# Patient Record
Sex: Female | Born: 1955 | Race: White | Hispanic: No | Marital: Married | Smoking: Former smoker
Health system: Southern US, Community
[De-identification: ages and names within clinical notes are randomized; demographics above are authoritative.]

## PROBLEM LIST (undated history)

## (undated) DIAGNOSIS — Z8719 Personal history of other diseases of the digestive system: Secondary | ICD-10-CM

## (undated) DIAGNOSIS — I480 Paroxysmal atrial fibrillation: Secondary | ICD-10-CM

## (undated) DIAGNOSIS — Z8679 Personal history of other diseases of the circulatory system: Secondary | ICD-10-CM

## (undated) DIAGNOSIS — G4733 Obstructive sleep apnea (adult) (pediatric): Secondary | ICD-10-CM

## (undated) DIAGNOSIS — R001 Bradycardia, unspecified: Secondary | ICD-10-CM

## (undated) DIAGNOSIS — E785 Hyperlipidemia, unspecified: Secondary | ICD-10-CM

## (undated) DIAGNOSIS — I10 Essential (primary) hypertension: Secondary | ICD-10-CM

## (undated) DIAGNOSIS — K5792 Diverticulitis of intestine, part unspecified, without perforation or abscess without bleeding: Secondary | ICD-10-CM

## (undated) DIAGNOSIS — M1990 Unspecified osteoarthritis, unspecified site: Secondary | ICD-10-CM

## (undated) HISTORY — DX: Personal history of other diseases of the digestive system: Z87.19

## (undated) HISTORY — DX: Paroxysmal atrial fibrillation: I48.0

## (undated) HISTORY — DX: Hyperlipidemia, unspecified: E78.5

## (undated) HISTORY — PX: REPLACEMENT TOTAL KNEE BILATERAL: SUR1225

## (undated) HISTORY — DX: Diverticulitis of intestine, part unspecified, without perforation or abscess without bleeding: K57.92

## (undated) HISTORY — DX: Personal history of other diseases of the circulatory system: Z86.79

## (undated) HISTORY — DX: Bradycardia, unspecified: R00.1

## (undated) HISTORY — DX: Obstructive sleep apnea (adult) (pediatric): G47.33

## (undated) HISTORY — PX: CARDIAC CATHETERIZATION: SHX172

## (undated) HISTORY — DX: Unspecified osteoarthritis, unspecified site: M19.90

## (undated) HISTORY — DX: Morbid (severe) obesity due to excess calories: E66.01

## (undated) HISTORY — DX: Essential (primary) hypertension: I10

---

## 2012-07-04 LAB — PULMONARY FUNCTION TEST

## 2012-08-22 ENCOUNTER — Ambulatory Visit

## 2015-12-31 HISTORY — PX: ATRIAL FIBRILLATION ABLATION: EP1191

## 2016-03-21 ENCOUNTER — Ambulatory Visit (HOSPITAL_BASED_OUTPATIENT_CLINIC_OR_DEPARTMENT_OTHER): Admitting: Nurse Practitioner

## 2016-03-29 ENCOUNTER — Ambulatory Visit: Admitting: Nurse Practitioner

## 2016-03-29 NOTE — Progress Notes (Signed)
* * *         [facility], [facility]**        ---    Lawernce Ion. Sindy Messing, MD [facility] Kieth Brightly. Donnetta Simpers, MD [facility] Barbera Setters Byrd Hesselbach, MD  [facility];    Darlyn Chamber, MD [facility] Roosvelt Maser, MD [facility] Shanon Brow, MD [facility]  Nile Dear. Audley Hose, MD [facility]    Kandis Mannan A. Karie Mainland, MD [facility] Johnell Comings. MacNaught, MD [facility] Mitchel Honour, MD Erma Pinto, MD [facility]    Colletta Maryland, MD Weston Anna, MD [facility] Kerby Moors, NP Maximino Greenland ,  NP        * * *     **Patient Name:** Emma [name]   **Date:** 03/29/2016    --- ---     **DOB:** 1956-05-07     **Referring Provider:** Mardene Sayer   **Appointment Provider:** Danelle Earthly, ACNP        * * *    03/29/2016  Progress Notes: Danelle Earthly, ACNP    --- ---    ---        Current Medications    ---    Taking     * loratadine 10 mg tablet 1 tab(s) orally once a day    ---    * losartan 50 mg tablet 1 tab(s) orally BID    ---    * Xarelto 20 mg tablet TAKE ONE TABLET BY MOUTH IN THE EVENING orally Q.D.    ---    * Tylenol Extra Strength 500 mg tablet 2 tab(s) orally prn    ---    * Metoprolol Succinate ER 25 mg tablet, extended release 1 tab(s) orally bid    ---    * flecainide 50 mg tablet 1 tab(s) orally bid    ---    * hydrochlorothiazide 25 mg tablet 1 tab(s) orally once a day    ---    * Medication List reviewed and reconciled with the patient    ---      Past Medical History    ---      PAF    ---    HTN    ---    Obesity    ---    Osteoarthritis    ---    Spinal stenosis    ---      Surgical History    ---      C section    ---    rhinoplasty    ---    knee replacement    ---      Family History    ---      Father: deceased 1 yrs, CAD, MI, AAA    ---    Mother: deceased 29 yrs, Died of CA, initially lymphoma then sarcomas in leg  with mets    ---    Siblings: sister- HTN, CAD    ---      Social History    ---    no Smoking    Discontinued: _1980s_    Status: _former smoker_    How long has it been since you last smoked? _> 10 years_    Patient  counseled on the dangers of tobacco use: _06/29/2017_    Advised to lose weight: Yes Discussed elevated BMI with the patient and  importance of losing weight for cardiovascular and general overall health.    No Presence of Falls .    no ETOH.    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    ---      Allergies    ---      ace inhibitors    ---      Review of Systems    ---     _._ :    no Change in Vision. no Major change in weight. no Nausea. no vomitting. no  Diarrhea. no hematemesis. no Blood in urine. no blood in stool. no black,  tarry stool. no focal weakness, numbness or tingling. no rashes. no  hallucinations. no fevers, chills. no decrease in appetite. no Intrascapular  pain. no Bruising. no abdominal pain.    _Cardiology_ :    no chest pain. no palpitations. no leg edema. no dizziness. no shortness of  breath. no Orthopnea. no PND. no claudication. no syncope. no dyspnea with  exertion. no exercise intolerance.            Reason for Appointment    ---      1\. Palpitations and recurrent pAF    ---      History of Present Illness    ---     _HPI_ :    60 year old with a history of PAF, hypertension, and obesity who is seen today  per her request for evaluation of 2 episodes of palpitations. Per her report  was admitted to [facility] in rapid AF in April of this year  and required dilt gtt. Converted after about 5 hrs and was sent home on her  usual Flecainide and Metoprolol doses. She did well until about 5 days ago  when she developed palps with a HR of 120 bpm. She was due for her next dose  of Flecainide and Metoprolol and after taking her meds she went to bed and  woke up the next day in SR. She remains on Xarelto and is fully compliant. Had  been having issue with Cpap machine at home but a week ago got a new mask and  has been using it nightly without issues. She continues to work nights, has  gained weight, and remains relatively inactive.    Denies chest pain or  dyspnea nausea vomiting diaphoresis PND orthopnea  lightheadedness palpitations or syncope. Has chronic, dependent LE edema  unchanged.      Vital Signs    ---    HR 56, BP 132/96, Wt 330 (+12), Ht 65, BMI 54.91, Repeat BP 136/94, Med  Assist: AN.      Examination    ---     _HS_ :    General: pleasant, obese, middle aged female, in no distress.    ENMT EOMI, anicteric, MMM, OP clear.    Neck: no JVD, bruits, or lymphadenopathy.    Pulmonary System clear to auscultation bilaterally, no wheezes or crackles.    Cardiac: PMI is nondisplaced, normal S1 S2, regular rate, no murmur, gallop or  pericardial rub.    GI SYSTEM obese, soft, NT/ND, BS present, no guarding or rigidity.    Extremities no clubbing, no edema, + lymphedema.    Peripheral pulses: normal (2+) bilaterally.    Skin: normal, no rash.    Neurologic exam: grossly non-focal.    Pyschiatric appropriate.          Data    ---     _EKG (reviewed personally)_ :    03/16/15:Sinus rhythm, 62.    _Lipids_ :    4/16: TC 214 TRI 103 HDL 60  LDL 133.    3/15: TC 214, TG 68, HDL 58, LDL 142.    _Labs_ :    3/15: Na 140, K 4, BUN 15, Cr 0.6, Hgb 14 .    _Lexiscan Myoview_ :    2012: normal.          Impression/Recommendations    ---       **1\. Paroxysmal atrial fibrillation**    Start Metoprolol Tartrate tablet, 25 mg, 1 tab(s), orally, orally once daily  as needed for episodes of atrial fibrillation, 30 day(s), 30, Refills 0    _IMAGING: **EKG_   Results reviewed by Physician  results reviewed by  physician     --- --- ---        Clinical Notes: Intermittent, pAF with generally rare episodes although 2  episodes in last 6 mo. She is very symptomatic with palpitations and HR can  get up to 160 - 170 bpm in AF. Has always been self limited. Recent episodes ?  precipitated by poorly functioning Cpap mask which has now been replaced.  Options including higher dose of Flecainide vs PRN "rescue" short acting BB  reviewed. She has elected to trial PRN BB. She will remain on  BID Metoprolol  Succinate as previous. If she has an episode of palps has been instructed to  take 25 mg of Metoprolol Tartrate PRN. To call if episode frequency continue  to increase as ablation may be a consideration as well. Ultimately I have  advised that she work on weight loss and continue with recent compliance with  Cpap.        **2\. Essential (primary) hypertension**    Clinical Notes: Well controlled on currant regimen. No issues with orthostasis  or hypotension.        **3\. Hypercholesterolemia**    Notes: Patient Educated with: AHANutritionSheet.pdf (AHANutritionSheet.pdf).    Clinical Notes: Benefits of weight loss and gradual graded exercise program  reviewed. Requesting recent lab work from her PCP.      Procedure Codes    ---      93000 IH    ---      Follow Up    ---    Dr. Audley Hose 2 mo    Electronically signed by Homero Fellers MD on 04/01/2016 at 07:31 PM EDT    Sign off status: Completed        * * *        [facility]    [address]    [place], [place] [address]    Tel: [phone]    Fax: [phone]              * * *          Patient: AVLEEN, BORDWELL DOB: 20-Feb-1956 Progress Note: Danelle Earthly, ACNP  03/29/2016    ---    Note generated by eClinicalWorks EMR/PM Software ([email or web address])

## 2016-03-29 NOTE — Progress Notes (Signed)
* * *        **  [name]    --- ---    28 Y old Female, DOB: Jul 23, 1956    [address], [address], [place], [place] [address]    Home: [phone]    Provider: Danelle Earthly        * * *    Telephone Encounter    ---    Answered by   Danelle Earthly  Date: 03/29/2016         Time: 02:18 PM    Reason   recent labs    --- ---            Message                      Had labs at [facility] in April 2017.  PCP has a copy.  Can we please get copy from her PCP                Action Taken   Neill Jurewicz 03/29/2016 2:19:06 PM > . Dominique,Denise 04/04/2016  10:41:09 AM > called pcp..they have no idea where the labs were  put...suggested I get them from [facility] general..Marland KitchenI faxed for copy of labs  Dominique,Denise 04/20/2016 12:47:44 PM > labs are in documents Janeane Cozart  04/23/2016 4:02:18 PM > Reviewed and WNL                * * *                ---          * * *          Patient: Emma [name] A DOB: 07-15-1956 Provider: Danelle Earthly  03/29/2016    ---    Note generated by eClinicalWorks EMR/PM Software ([email or web address])

## 2016-04-11 ENCOUNTER — Ambulatory Visit

## 2016-04-20 ENCOUNTER — Ambulatory Visit

## 2016-06-21 ENCOUNTER — Ambulatory Visit: Admitting: Cardiovascular Disease

## 2016-06-21 NOTE — Progress Notes (Signed)
* * *         [facility], [facility]**        ---    Lawernce Ion. Sindy Messing, MD [facility] Kieth Brightly. Donnetta Simpers, MD [facility] Barbera Setters Byrd Hesselbach, MD  [facility];    Darlyn Chamber, MD [facility] Roosvelt Maser, MD [facility] Shanon Brow, MD [facility]  Nile Dear. Audley Hose, MD [facility]    Kandis Mannan A. Karie Mainland, MD [facility] Johnell Comings. MacNaught, MD [facility] Mitchel Honour, MD Erma Pinto, MD [facility]    Colletta Maryland, MD Weston Anna, MD [facility] Kerby Moors, NP Maximino Greenland ,  NP        * * *     **Patient Name:** Emma [name]   **Date:** 06/21/2016    --- ---     **DOB:** 02-27-1956     **Referring Provider:** Mardene Sayer   **Appointment Provider:**  [name], MD        * * *    06/21/2016  Progress Notes: [name], MD    --- ---    ---        Current Medications    ---    Taking     * Metoprolol Tartrate 25 mg tablet 1 tab(s) orally orally once daily as needed for episodes of atrial fibrillation    ---    * loratadine 10 mg tablet 1 tab(s) orally once a day    ---    * losartan 50 mg tablet 1 tab(s) orally BID    ---    * Xarelto 20 mg tablet TAKE ONE TABLET BY MOUTH IN THE EVENING orally Q.D.    ---    * Tylenol Extra Strength 500 mg tablet 2 tab(s) orally prn    ---    * Metoprolol Succinate ER 25 mg tablet, extended release 1 tab(s) orally bid    ---    * flecainide 50 mg tablet 1 tab(s) orally bid    ---    * hydrochlorothiazide 25 mg tablet 1 tab(s) orally once a day    ---    * Medication List reviewed and reconciled with the patient    ---      Past Medical History    ---      PAF    ---    HTN    ---    Obesity    ---    Osteoarthritis    ---    Spinal stenosis    ---      Surgical History    ---      C section    ---    rhinoplasty    ---    knee replacement    ---      Family History    ---      Father: deceased 38 yrs, CAD, MI, AAA    ---    Mother: deceased 63 yrs, Died of CA, initially lymphoma then sarcomas in leg  with mets    ---    Siblings: sister- HTN, CAD    ---      Social History    ---     Smoking Discontinued: 1980s, Status: former smoker, How long has it been since  you last smoked? > 10 years, Patient counseled on the dangers of tobacco use:  03/29/2016.    Advised to lose weight: Yes Discussed elevated BMI with the patient and  importance of losing weight for cardiovascular and general overall health,  Discussed elevated BMI with the patient and importance of losing weight for  cardiovascular and general overall health.    No Presence of Falls .    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    ---      Allergies    ---      ace inhibitors    ---      Review of Systems    ---     _._ :    no Change in Vision. no Major change in weight. no Nausea. no vomitting. no  Diarrhea. no hematemesis. no Blood in urine. no blood in stool. no black,  tarry stool. no focal weakness, numbness or tingling. no rashes. no  hallucinations. no fevers, chills. no decrease in appetite. no Intrascapular  pain. no Bruising. no abdominal pain.            Reason for Appointment    ---      1\. 2 mos per Nancy;called pt mailed appt. card    ---      History of Present Illness    ---     _HPI_ :    60 year old with a history of PAF, hypertension, and obesity, followed by Dr.  Ruffin Frederick, who presents for followup. She was last seen 3 months ago by NP Jacqulynn Cadet  due to recurrent atrial fibrillation.. Since her last visit no recurrence of  palpitations or atrial fibrillation. Has not needed as needed metoprolol.  Taking medications fairly consistently with only an occasional missed dose.  Otherwise denies chest pain or dyspnea nausea vomiting diaphoresis PND  orthopnea lightheadedness palpitations or syncope.      Vital Signs    ---    HR 52, BP 120/82, Wt 328, Ht 65, BMI 54.58.      Examination    ---     _HS_ :    General: pleasant, obese, middle aged female, in no distress. ENMT EOMI,  anicteric, MMM, OP clear. Neck: no JVD, bruits, or lymphadenopathy. Pulmonary  System clear to auscultation bilaterally, no  wheezes or crackles. Cardiac: PMI  is nondisplaced, normal S1 S2, regular rate, no murmur, gallop or pericardial  rub. GI SYSTEM obese, soft, NT/ND, BS present, no guarding or rigidity.  Extremities no clubbing, no edema, + lymphedema. Peripheral pulses: normal  (2+) bilaterally. Skin: normal, no rash. Neurologic exam: grossly non-focal.  Pyschiatric appropriate.          Data    ---     _EKG (reviewed personally)_ :    06/21/16: Sinus rhythm, 52, poor R wave progression.    _Lipids_ :    4/16: TC 214 TRI 103 HDL 60 LDL 133. 3/15: TC 214, TG 68, HDL 58, LDL 142.    _Lexiscan Myoview_ :    2012: normal.          Impression/Recommendations    ---       **1\. Paroxysmal atrial fibrillation**    Continue flecainide tablet, 50 mg, 1 tab(s), orally, bid, 90, 180, Refills 3    Continue hydrochlorothiazide tablet, 25 mg, 1 tab(s), orally, once a day, 90,  90, Refills 3    _IMAGING: **EKG_    Clinical Notes: Paroxysmal AF, reasonably controlled with flecainide. Using  metoprolol as needed and doing relatively well. No changes made  today.utilizing CPAP mask consistently.    ---        **2\. Hypercholesterolemia**    Notes: Patient Educated with: AHANutritionSheet.pdf (AHANutritionSheet.pdf).  Clinical Notes: Strongly encouraged for weight loss.        **3\. Essential (primary) hypertension**    Clinical Notes: Normotensive and stable current regimen.      Procedure Codes    ---      93000 IH    ---      Follow Up    ---    6 Months    Electronically signed by Homero Fellers MD on 06/21/2016 at 02:36 PM EDT    Sign off status: Completed        * * *        [facility]    [address]    [place], [place] [address]    Tel: [phone]    Fax: [phone]              * * *          Patient: DANYELE, SMEJKAL DOB: 1956/06/01 Progress Note: [name], MD 06/21/2016    ---    Note generated by eClinicalWorks EMR/PM Software ([email or web address])

## 2016-07-31 ENCOUNTER — Inpatient Hospital Stay
Admit: 2016-07-31 | Disposition: A | Discharge status: Home / Self Care | Source: Home / Self Care | Attending: Internal Medicine | Admitting: Internal Medicine

## 2016-07-31 LAB — HX .AUTOMATED DIFF
CASE NUMBER: 2017304002620
HX ABSOLUTE NEUTRO COUNT: 5280 /mm3
HX BASOPHILS: 1 % — NL (ref 0.0–1.0)
HX EOSINOPHILS: 4 % — ABNORMAL HIGH (ref 0.0–3.0)
HX IMMATURE GRANULOCYTES: 0 % — NL (ref 0.0–2.0)
HX LYMPHOCYTES: 24 % — NL (ref 22.0–40.0)
HX MONOCYTES: 6 % — NL (ref 0.0–11.0)
HX NEU CT MEASURED: 5.28
HX NEUTROPHILS: 64 % — NL (ref 40.0–71.0)

## 2016-07-31 LAB — HX CBC W/ DIFF
CASE NUMBER: 2017304002620
HX HCT: 42.7 % — NL (ref 36.0–47.0)
HX HGB: 14.3 g/dL — NL (ref 11.8–15.8)
HX MCH: 30 pg — NL (ref 27.0–31.0)
HX MCHC: 33.5 g/dL — NL (ref 32.0–36.0)
HX MCV: 90 fL — NL (ref 81.0–99.0)
HX MPV: 9.3 fL — NL (ref 7.4–11.5)
HX NRBC PERCENT: 0 % — NL
HX PLATELET: 330 10*3/uL — NL (ref 150.0–400.0)
HX RBC: 4.76 10*6/uL — NL (ref 3.6–5.0)
HX RDW: 13.2 % — NL (ref 11.5–14.5)
HX WBC: 8.3 10*3/uL — NL (ref 3.7–11.2)

## 2016-07-31 LAB — HX PTT
CASE NUMBER: 2017304002620
HX PTT: 34.3 s — ABNORMAL HIGH (ref 23.4–32.1)

## 2016-07-31 LAB — HX PT
CASE NUMBER: 2017304002620
HX INR: 1.1
HX PT: 12.2 s — ABNORMAL HIGH (ref 9.3–11.6)
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
## 2016-07-31 NOTE — H&P (Signed)
cardiac enzymes -3 negative    Thyroid profile within normal limits    We'll check UA, urine culture    SIGNATURE LINE Electronically signed by Derry Lory MD-HOSP, Laaibah Wartman on  08/01/2016 at 15:47:44 EST

## 2016-07-31 NOTE — H&P (Signed)
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 While  she was working yesterday, noticed palpitation found to have a rapid atrial  fibrillation. Cardizem drip was started ,admitted here for further  management.patient had a prior hospitalization at [facility]  for rapid A. fib in April 2017,at the time her symptoms improved after she  received IV Cardizem .patient still having palpitation.on admission patient  had chest pain, neck discomfort, currently resolved.    no shortness of breath    No cough, fever, chills    No Nausea, vomiting,Diarrhea, dysuria              Code Status    Code Status - Ordered    -- 08/01/16 2:11:00 EDT, Full Resuscitation, Constant Order          Physical Exam    Vitals & Measurements    **T:** 96.9 F (Axillary) **TMIN:** 96.9 F (Axillary) **TMAX:** 98.7 F  (Oral) **HR:** 95 **RR:** 17 **BP:** 118/68 **SpO2:** 94% **O2 Rate:** 2 **O2  Method (L/min):** Room air **WT:** 152.7 Kg    General: Alert and oriented, no acute distress    Eye: PERRL, Normal Conjunctiva    HEENT: Normocephalic, no damage to dentition, TM's clear, good light reflex,  normal hearing, moist oral mucosa, no pharyngeal erythema, ear canals patent,  no sinus tenderness    Neck: supple, nontender, carotid pulse WNL, no carotid bruit, no JVD, no  lymphadenopathy, no thyromegaly, full ROM    Respiratory: Lungs clear to auscultation, respirations non-labored, breath  sounds equal and regular, symmetrical chest expansion, no chest wall  tenderness    Cardiovascular:tachycardia, irregularly irregular heartbeat, no gallop, good  pulses equal in all extremities, normal peripheral perfusion,    Gastrointestinal: Abdomen soft, non tender, non-distended, normal bowel sounds  all four quadrants, no organomegaly,    Genitourinary: No CVA  tenderness,    Musculoskeletal: normal ROM, normal strength, no tenderness, no swelling, no  deformity, normal gait_    Integumentary: Skin intact, warm, pink, dry. No pallor, no rash.    Neurologic: Alert, Oriented, normal sensory, normal motor, no focal deficits.    Cognition and Speech: Oriented, speech clear and coherent, functional  cognition intact, expression WNL    Psychiatric: Cooperative, appropriate mood & affect, normal judgment, non-  suicidal      Impression and Plan    Atrial fibrillation with rapid ventricular response    flecainide 300 mg by mouth -1    Wean off Cardizem drip    Continue metoprolol 25 mg po twice a day    continue rivaroxaban 20 mg po daily    cardiology consult    echocardiogram    2.hypertension    Continue ARB, beta blocker        Protonix for GI prophylaxis    rivaroxaban for DVT Prophylaxis        critical care time 40 minutes    CMS Two-Midnight Rule    I certify that hospital inpatient services are reasonable and medically  necessary. They are appropriately provided as inpatient services in accordance  with the two midnight benchmark under 42CFR 412 3(e), or the services are  specified as inpatient only procedure under 42 CFR 419 22(n).          Problem List/Past Medical History    _Ongoing_  No qualifying data    _Historical_    No qualifying data    #1 paroxysmal atrial fibrillation    #2 hypertension    #3 morbid obesity    Procedure/Surgical History    Total Knee Replacement (07/22/2012), Total Knee Replacement (05/08/2011),  c-section X2 (1990), rhinoplasty (1976), A fib.    Social History    _Alcohol_    Current, Wine, 1-2 times per month    _Other_    coffee- half caf 2-3 cups a day; diet soda- occasionally    _Substance Abuse_ \- Denies Substance Abuse    _Tobacco_ \- Denies Tobacco Use    Past, Cigarettes    nursing staff at [employer]    She doesn't smoke cigarettes she doesn't drink alcohol    Family History    Heart attack: Father.    Heart  disease: Father and Sister.    High blood pressure: Father and Sister.    High cholesterol: Father.    Non-Hodgkin's lymphoma: Mother.    Percutaneous coronary intervention: Sister.    Allergies    ACE inhibitors (cough)    Medications    _Inpatient_    Cardizem IV Additive 100 mg + Dextrose 5% in Water 100 mL    flecainide, 50 mg= 1 tab(s), PO, BID    Lopressor, 25 mg= 1 tab(s), PO, BID    rivaroxaban, 20 mg= 1 tab(s), PO, Daily    Tylenol 500 mg oral tablet, 500 mg= 1 tab(s), PO, q6hr, PRN    valsartan, 80 mg= 1 tab(s), PO, Daily    _Home_    flecainide 50 mg oral tablet, 50 mg= 1 tab(s), PO, BID    hydrochlorothiazide, 25 mg, PO, Daily    losartan 25 mg oral tablet, 50 mg= 2 tab(s), PO, BID    metoprolol tartrate 25 mg oral tablet, 25 mg= 1 tab(s), PO, BID    Xarelto, 20 mg, PO, Daily    Diet    Cardiac Solid Diet - Ordered    -- 08/01/16 8:39:00 EDT, Room Service, Scheduled / PRN          Lab Results    Glucose Lvl: 82 mg/dL (27/06/23 76:28:31)    BUN: 14 mg/dL (51/76/16 07:37:10)    Creatinine: 0.579 mg/dL (62/69/48 54:62:70)    Afn Amer Glomerular Filtration Rate: >90 (08/01/16 00:04:53)    Non-Afn Amer Glomerular Filtration Rate: >90 (08/01/16 00:04:55)    Sodium Lvl: 141 mmol/L (08/01/16 00:04:51)    Potassium Lvl: 3.6 mmol/L (08/01/16 00:04:51)    Chloride: 105 mmol/L (08/01/16 00:04:51)    CO2: 27 mmol/L (08/01/16 00:04:51)    Anion Gap: 9 (08/01/16 00:04:51)    Total Protein: 7.8 Gm/dL (35/00/93 81:82:99)    Albumin Lvl: 3.7 Gm/dL (37/16/96 78:93:81)    Calcium Lvl: 9.2 mg/dL (01/75/10 25:85:27)    Magnesium Lvl: 2 mg/dL (78/24/23 53:61:44)    Bilirubin Total: 0.4 mg/dL (31/54/00 86:76:19)    Alkaline Phosphatase: 87 Units/L (08/01/16 00:04:51)    AST: 18 Units/L (08/01/16 00:04:51)    ALT: 26 Units/L (08/01/16 00:04:51)    3rd Gen TSH: 2.25 mclU/ml (08/01/16 00:09:43)    Troponin I: 0.017 nGm/ml (08/01/16 08:43:50)    WBC: 8.3 thous/mm3 (07/31/16 23:32:08)    RBC: 4.76 Mil/mm3 (07/31/16 23:32:08)    Hgb:  14.3 Gm/dL (50/93/26 71:24:58)    Hct: 42.7 % (07/31/16 23:32:08)    Platelet: 330 thous/mm3 (07/31/16 23:32:08)    MCV: 90 fL (07/31/16 23:32:08)    MCH: 30 pGm (07/31/16  23:32:08)    MCHC: 33.5 Gm/dL (09/81/19 14:78:29)    RDW: 13.2 % (07/31/16 23:32:08)    MPV: 9.3 fL (07/31/16 23:32:08)    Neutrophils: 64 % (07/31/16 23:32:10)    Lymphocytes: 24 % (07/31/16 23:32:10)    Monocytes: 6 % (07/31/16 23:32:10)    Eosinophils: 4 % High (07/31/16 23:32:10)    Basophils: 1 % (07/31/16 23:32:10)    Immature Granulocytes: 0 % (07/31/16 23:32:10)    NRBC Percent: 0 % (07/31/16 23:32:08)    Absolute Neutro Count: 5280 /mm3 (07/31/16 23:32:10)    Lav Top Tube to Hold: DONE (08/01/16 03:16:29)    PT: 12.2 sec High (07/31/16 23:50:06)    INR: 1.1 (07/31/16 23:50:06)    PTT: 34.3 sec High (07/31/16 23:50:07)          Diagnostic Results        ------        SIGNATURE LINE Electronically signed by Derry Lory MD-HOSP, Avayah Raffety on  08/01/2016 at 15:47:44 EST

## 2016-07-31 NOTE — H&P (Signed)
patient was hypotensive systolic blood pressure 70s, bradycardia mid 30s  symptomatic        IV fluid bolus 1 L    Atropine 0.5 IV -1 given    EKG    Cardiology consultation    SIGNATURE LINE Electronically signed by Derry Lory MD-HOSP, Mackinley Kiehn on  08/01/2016 at 15:47:44 EST

## 2016-08-01 LAB — HX COMPREHENSIVE METABOLIC PANEL
CASE NUMBER: 2017304002620
HX ALBUMIN LVL: 3.7 g/dL — NL (ref 3.5–5.0)
HX ALKALINE PHOSPHATASE: 87 U/L — NL (ref 45.0–117.0)
HX ALT: 26 U/L — NL (ref 6.0–78.0)
HX ANION GAP: 9 — NL (ref 3.0–11.0)
HX AST: 18 U/L — NL (ref 6.0–40.0)
HX BILIRUBIN TOTAL: 0.4 mg/dL — NL (ref 0.2–1.2)
HX BUN: 14 mg/dL — NL (ref 7.0–18.0)
HX CALCIUM LVL: 9.2 mg/dL — NL (ref 8.5–10.1)
HX CHLORIDE: 105 mmol/L — NL (ref 98.0–110.0)
HX CO2: 27 mmol/L — NL (ref 21.0–32.0)
HX CREATININE: 0.579 mg/dL — NL (ref 0.55–1.3)
HX GLUCOSE LVL: 82 mg/dL — NL (ref 65.0–110.0)
HX POTASSIUM LVL: 3.6 mmol/L — NL (ref 3.6–5.2)
HX SODIUM LVL: 141 mmol/L — NL (ref 136.0–145.0)
HX TOTAL PROTEIN: 7.8 g/dL — NL (ref 6.0–8.0)

## 2016-08-01 LAB — HX URINALYSIS (COMPLETE)
CASE NUMBER: 2017305000949
HX UA BILIRUBIN: NEGATIVE — NL
HX UA BLOOD: NEGATIVE — NL
HX UA GLUCOSE: NEGATIVE — NL
HX UA KETONES: NEGATIVE — NL
HX UA NITRITE: POSITIVE — AB
HX UA PH: 7 — NL (ref 5.0–8.0)
HX UA PROTEIN: NEGATIVE — NL
HX UA RBC: <1 — NL (ref 0.0–3.0)
HX UA SPECIFIC GRAVITY: 1.023 — NL (ref 1.005–1.03)
HX UA SQUAMOUS EPITHELIAL: 2 /HPF — NL (ref 0.0–5.0)
HX UA UROBILINOGEN: NEGATIVE — NL
HX UA WBC: <1 — NL (ref 0.0–5.0)

## 2016-08-01 LAB — HX BASIC METABOLIC PANEL
CASE NUMBER: 2017305002308
HX ANION GAP: 11 — NL (ref 3.0–11.0)
HX BUN: 18 mg/dL — NL (ref 7.0–18.0)
HX CALCIUM LVL: 8.7 mg/dL — NL (ref 8.5–10.1)
HX CHLORIDE: 103 mmol/L — NL (ref 98.0–110.0)
HX CO2: 25 mmol/L — NL (ref 21.0–32.0)
HX CREATININE: 0.83 mg/dL — NL (ref 0.55–1.3)
HX GLUCOSE LVL: 258 mg/dL — ABNORMAL HIGH (ref 65.0–110.0)
HX POTASSIUM LVL: 3.7 mmol/L — NL (ref 3.6–5.2)
HX SODIUM LVL: 139 mmol/L — NL (ref 136.0–145.0)

## 2016-08-01 LAB — HX MAGNESIUM LEVEL
CASE NUMBER: 2017304002620
HX MAGNESIUM LVL: 2 mg/dL — NL (ref 1.8–2.4)

## 2016-08-01 LAB — HX CBC W/ INDICES
CASE NUMBER: 2017305002308
HX HCT: 39.8 % — NL (ref 36.0–47.0)
HX HGB: 13.2 g/dL — NL (ref 11.8–15.8)
HX MCH: 30 pg — NL (ref 27.0–31.0)
HX MCHC: 33.2 g/dL — NL (ref 32.0–36.0)
HX MCV: 90 fL — NL (ref 81.0–99.0)
HX MPV: 9.4 fL — NL (ref 7.4–11.5)
HX NRBC PERCENT: 0 % — NL
HX PLATELET: 308 10*3/uL — NL (ref 150.0–400.0)
HX RBC: 4.44 10*6/uL — NL (ref 3.6–5.0)
HX RDW: 13.6 % — NL (ref 11.5–14.5)
HX WBC: 8.2 10*3/uL — NL (ref 3.7–11.2)

## 2016-08-01 LAB — HX TSH REFLEX PANEL (RECOMMENDED)
CASE NUMBER: 2017304002621
HX 3RD GEN TSH: 2.25 u[IU]/mL — NL (ref 0.358–3.74)

## 2016-08-01 LAB — HX TROPONIN I
CASE NUMBER: 2017304002620
CASE NUMBER: 2017305000250
CASE NUMBER: 2017305000251
HX TROPONIN I: <0.015 — NL (ref 0.015–0.045)
HX TROPONIN I: <0.015 — NL (ref 0.015–0.045)
HX TROPONIN I: 0.017 ng/mL — NL (ref 0.015–0.045)

## 2016-08-01 LAB — HX GLOMERULAR FILTRATION RATE (ESTIMATED)
CASE NUMBER: 2017304002620
HX AFN AMER GLOMERULAR FILTRATION RATE: >90
HX NON-AFN AMER GLOMERULAR FILTRATION RATE: >90

## 2016-08-01 LAB — HX LAVENDER TOP TO HOLD: CASE NUMBER: 2017305000250

## 2016-08-02 ENCOUNTER — Ambulatory Visit: Admitting: Cardiovascular Disease

## 2016-08-02 LAB — HX GLOMERULAR FILTRATION RATE (ESTIMATED)
CASE NUMBER: 2017306000339
HX AFN AMER GLOMERULAR FILTRATION RATE: >90
HX NON-AFN AMER GLOMERULAR FILTRATION RATE: >90

## 2016-08-02 LAB — HX CBC W/ INDICES
CASE NUMBER: 2017306000340
HX HCT: 38.2 % — NL (ref 36.0–47.0)
HX HGB: 13 g/dL — NL (ref 11.8–15.8)
HX MCH: 30 pg — NL (ref 27.0–31.0)
HX MCHC: 34 g/dL — NL (ref 32.0–36.0)
HX MCV: 89 fL — NL (ref 81.0–99.0)
HX MPV: 9.1 fL — NL (ref 7.4–11.5)
HX NRBC PERCENT: 0 % — NL
HX PLATELET: 273 10*3/uL — NL (ref 150.0–400.0)
HX RBC: 4.28 10*6/uL — NL (ref 3.6–5.0)
HX RDW: 13 % — NL (ref 11.5–14.5)
HX WBC: 8.7 10*3/uL — NL (ref 3.7–11.2)

## 2016-08-02 LAB — HX PHOSPHORUS LEVEL
CASE NUMBER: 2017306000339
HX PHOSPHORUS: 3.4 mg/dL — NL (ref 2.4–4.9)

## 2016-08-02 LAB — HX BASIC METABOLIC PANEL
CASE NUMBER: 2017306000339
HX ANION GAP: 7 — NL (ref 3.0–11.0)
HX BUN: 14 mg/dL — NL (ref 7.0–18.0)
HX CALCIUM LVL: 8.5 mg/dL — NL (ref 8.5–10.1)
HX CHLORIDE: 107 mmol/L — NL (ref 98.0–110.0)
HX CO2: 28 mmol/L — NL (ref 21.0–32.0)
HX CREATININE: 0.474 mg/dL — ABNORMAL LOW (ref 0.55–1.3)
HX GLUCOSE LVL: 105 mg/dL — NL (ref 65.0–110.0)
HX POTASSIUM LVL: 3.6 mmol/L — NL (ref 3.6–5.2)
HX SODIUM LVL: 142 mmol/L — NL (ref 136.0–145.0)

## 2016-08-02 LAB — HX MAGNESIUM LEVEL
CASE NUMBER: 2017306000339
HX MAGNESIUM LVL: 2 mg/dL — NL (ref 1.8–2.4)

## 2016-08-02 NOTE — Progress Notes (Signed)
* * *        **  Emma [name]    --- ---    56 Y old Female, DOB: [birth year]    [address], [address], [place], [place] [address]    Home: [phone]    Provider: [name], MD        * * *    Telephone Encounter    ---    Answered by   Mitchel Honour  Date: 08/02/2016         Time: 10:47 AM    Message                      Please arrange for outpatient consult with Dr. Malcolm Metro at [facility] for AF ablation, call her next week.  Patient is admitted at [facility] ([facility]).  Also needs hosp f/u w Dr. Audley Hose in 1 mo        --- ---            Action Taken   [name],[name] 08/03/2016 9:15:31 AM > called Dr Malcolm Metro at [facility]  appt is November 27-17 at 3:00pm pt medical record # is [id number] and pt has to  call registration at [phone] [name],[name] 08/03/2016 9:41:08 AM > also  made HFU 09-12-16 at 3:15 called pt LMOM                * * *                ---          * * *          Patient: [name] DOB: 10/25/1955 Provider: [name],  MD 08/02/2016    ---    Note generated by eClinicalWorks EMR/PM Software ([email or web address])

## 2016-08-09 ENCOUNTER — Ambulatory Visit: Admitting: General Acute Care Hospital

## 2016-08-11 LAB — HX .T-SPOT TB
CASE NUMBER: 2017313001090
HX T-SPOT TB: NEGATIVE
HX TB-NILCONT: 0
HX TB-PANEL-A: 0
HX TB-PANEL-B: 0
HX TB-POSITIVECONTROL: >20

## 2016-08-20 ENCOUNTER — Ambulatory Visit (HOSPITAL_BASED_OUTPATIENT_CLINIC_OR_DEPARTMENT_OTHER): Admitting: Cardiovascular Disease

## 2016-08-22 ENCOUNTER — Ambulatory Visit

## 2016-08-27 ENCOUNTER — Ambulatory Visit: Admitting: Cardiovascular Disease

## 2016-08-27 NOTE — Progress Notes (Signed)
* * *        Emma [name]**    --- ---    60 Y old Female, DOB: [birth year], External MRN: [id number]    Account Number: [id number]    [address], [address], [place], [place]-[address]    Home: [phone]    Insurance: HMO BLUE OUT IPA Payer ID: PAPER    PCP: Mardene Sayer, MD Referring: Nile Dear [facility] External Visit ID:  [id number]    Appointment Facility: [facility]        * * *    08/27/2016  Progress Notes: Guerry Minors, MD **CHN#:** [phone] [id number]    --- ---    ---        Current Medications    ---    Taking     * Flecainide Acetate 50 mg Tablet Orally twice daily    ---    * Hydrochlorothiazide 25 MG Tablet 1 tablet in the morning Orally Once a day    ---    * Loratadine 10 MG Tablet 1 tablet Orally Once a day    ---    * Losartan Potassium 50 mg Tablet 1 tablet Orally bid    ---    * Metoprolol Succinate ER 25 mg Tablet Extended Release 24 Hour 1 tablet Orally bid    ---    * Metoprolol Tartrate 25 mg Tablet 1 tablet with food Orally prn    ---    * Prevagen Extra Strength 20 MG Capsule Orally     ---    * Xarelto 20 MG Tablet 1 tablet with food Orally Once a day    ---      Past Medical History    ---       Paroxysmal Atrial fibrillation.        ---    Hypertension.        ---    Osteoarthritis.        ---    Obesity.        ---    Spinal Stenosis.        ---    OSA - on CPAP which she uses consistently.        ---      Surgical History    ---      Caesarian Section    ---    Rhinoplasty    ---    Knee Replacement    ---      Family History    ---      Father Deceased 67 years: CAD, MI, AAA    Mother: Deceased 30 years, died of CA: lymphoma, sarcoma with METS    Sister: CAD, HTN.    ---      Social History    ---      Quit Smoking in 1980s    No Illicit drugs.    ---      Allergies    ---      ace inhibitors    ---      Review of Systems    ---     _CardioVascular_ :    General/Constitutional no fever, chills, night sweats, sleep disturbance,  headaches, no change in appetite, no  fatigue, no weight gain/loss.  Gastrointestinal no melena, no hematochezia, hematemses, no blood in stool,  nausea, vomiting, diarrhea, abdominal pain. Peripheral Vascular no  claudication, no skin ulceration. Endocrine no polydipsia, polyphagia, hot and  cold intolerance. Respiratory no cough, wheezing, shortness of breath  at rest,  sore throat, upper airway congestion, or hemoptysis. Genitourinary no blood in  urine. Musculoskeletal no arthraglias, myalgias, or muscle aches.            Reason for Appointment    ---      1\. Atrial fibrillation    ---      History of Present Illness    ---     _Cardiology_ :    Thank you for referring [name] for evaluation and management of  her paroxysmal atrial fibrillation. We had the pleasure of seeing [name]  the company of her husband at the [facility] on 08/27/16.    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 She was  initially diagnosed with atrial fibrillation about 3 years ago. She developed  palpitations and chest pain during the night and was noted to have AFi with  RVR on presentation to the ER at [facility]. She was started on a Cardizem  drip at that time and spontaneously cardioverter. She had 3 episodes that year  each treated similarly. After the 3 rd episode she was started on metoprolol  and flecainide after a normal stress test and ECHO. She did well for 3 years  with no recurrences however in [place] she had a recurrence. again she was  treated with IV Cardizem and cardioverted. She had another episode in May but  did not go to the ER, she instead took an extra dose of flecainide and  cardioverted at home. She was subsequently given PRN short acting metoprolol  for this purpose. On Halloween night, October 30 th she had another episode  while  working the late shift. She took an extra a dose of short acting  metoprolol however it did not cardiovert her after 30 minutes so she went to  the ER where she was noted to have AF with a heart rate in the 160's. She was  given Flecainide 300 mg after which her atrial fibrillation broke into a  junctional rhythm with a slow ventricular rate requiring atropine and IV  dopamine before sinus rhythm was restored.    She presents now for consideration for an ablation. She has OSA and is  compliant with CPAP. She notes that over the past 3 years since starting  flecainide she has gained about 50 lbs. She is very inactive and does not  exercise. She does not drink alcohol, she has significantly reduced her coffee  intake. No illicit drugs.      Vital Signs    ---    Pain scale 0, Ht-in 65, Wt-lbs 325, BMI 54.08, BP 145/89, HR 61, SaO2 95%.      Physical Examination    ---     _General Examination_ :    General Appearance well developed, well nourished, alert, oriented, in no  acute distress. Oral Cavity mucosa moist. Skin no rashes. Heart regular rate  and rhythm, normal S1, normal S2. Lungs clear to auscultation bilaterally with  no wheezing, no crackles, no rhonchi. Chest no costochondral tenderness.  Abdomen soft, non tender, non distended, bowel sounds present, no abdominal or  flank bruits, no organomegaly, no prominent pulsation, no bruits. Neurological  nonfocal, alert and oriented, no gross focal deficits.          Assessments    ---  1\. Atrial fibrillation - I48.91 (Primary)    ---      60 year old female patient with paroxysmal atrial fibrillation previously  controlled on flecainide and metoprolol. She has had recurrences recently  despite compliance with medical therapy. [name] is a good candidate for  pulmonary vein isolation due to the paroxysmal nature of her highly  symptomatic paroxysmal atrial fibrillation. We had an extensive discussion  with her and with her husband about the rationale behind  recommending  pulmonary vein isolation, the risks and the potential benefits. Unfortunately  her morbid obesity would significantly lower the long-term success of this  procedure. In order to improve chances of a successful ablation we recommend  weight loss. She is very receptive to this and already has a plan in mind  including diet and increased activity. The other mitigating factor against a  long-term successful outcome is obstructive sleep apnea. On the other hand,  she is compliant with her CPAP which in and of itself enhances the long-term  success of PVI. Were reviewed with both Mrs. and Mr. Gudgel the risks of a  stroke, right phrenic nerve paralysis, vascular injury, injury to the  esophagus as well as musculoskeletal discomfort related to lying on her back  during the procedure and during recovery given her morbid obesity.    We also recommend an ECHO and a stress test since it has been 3 years since  she has been on flecainide. This will be done at Dr. Aundria Mems office. We will  see her again towards the end of January 2018. We will tentatively schedule  her for an ablation in February, which, fortuitously, is the earliest  available opening for an AF ablation with the optimism that she will achieve  meaningful weight loss by then. We would ask her to continue the Xarelto  uninterrupted but hold the flecainide a week before the anticipated date of  the ablation. She will have a TEE and a CT scan of her heart the day before  the procedure.    ---      Treatment    ---       **1\. Atrial fibrillation**    Continue Flecainide Acetate Tablet, 50 mg, Orally, twice daily    Continue Metoprolol Succinate ER Tablet Extended Release 24 Hour, 25 mg, 1  tablet, Orally, bid    Continue Metoprolol Tartrate Tablet, 25 mg, 1 tablet with food, Orally, prn    Continue Xarelto Tablet, 20 MG, 1 tablet with food, Orally, Once a day    ---      Follow Up    ---    2 Months    Electronically signed by Guerry Minors , MD on  08/28/2016 at 11:22 AM EST    Sign off status: Completed        * * *        [facility]    [address]    [place], [place] [address]    Tel: [phone]    Fax: [phone]              * * *          Patient: CARTINA, BROUSSEAU DOB: 20-Oct-1955 Progress Note: Guerry Minors, MD  08/27/2016    ---    Note generated by eClinicalWorks EMR/PM Software ([email or web address])

## 2016-08-27 NOTE — Progress Notes (Signed)
.  Progress Notes  .  Patient: Emma [name]  Provider: Guerry Minors  DOB:[birth year] Age: 60 Y Sex: Female  Supervising Provider:: Guerry Minors, MD  Date: 08/27/2016  .  PCP: Mardene Sayer  MD  Date: 08/27/2016  .  --------------------------------------------------------------------------------  .  REASON FOR APPOINTMENT  .  1. Atrial fibrillation  .  HISTORY OF PRESENT ILLNESS  .  Cardiology:  Thank you for referring [name] for evaluation and management of her paroxysmal atrial  fibrillation. We had the pleasure of seeing Ms. Ryser the  company of her husband at the [place] [facility] on  08/27/16. 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 She was initially  diagnosed with atrial fibrillation about 3 years ago. She  developed palpitations and chest pain during the night and was  noted to have AFi with RVR on presentation to the ER at [facility]. She was started on a Cardizem drip at that time and  spontaneously cardioverter. She had 3 episodes that year each  treated similarly. After the 3 rd episode she was started on  metoprolol and flecainide after a normal stress test and ECHO.  She did well for 3 years with no recurrences however in [place] she  had a recurrence. again she was treated with IV Cardizem and  cardioverted. She had another episode in May but did not go to  the ER, she instead took an extra dose of flecainide and  cardioverted at home. She was subsequently given PRN short acting  metoprolol for this purpose. On Halloween night, October 30 th  she had another episode while working the late shift. She took an  extra a dose of short acting metoprolol however it did not  cardiovert her after 30 minutes so she went to the ER where she  was noted to have AF with a heart rate in  the 160's. She was  given Flecainide 300 mg after which her atrial fibrillation broke  into a junctional rhythm with a slow ventricular rate requiring  atropine and IV dopamine before sinus rhythm was restored. She  presents now for consideration for an ablation. She has OSA and  is compliant with CPAP. She notes that over the past 3 years  since starting flecainide she has gained about 50 lbs. She is  very inactive and does not exercise. She does not drink alcohol,  she has significantly reduced her coffee intake. No illicit  drugs.  .  CURRENT MEDICATIONS  .  Taking Flecainide Acetate 50 mg Tablet Orally twice daily  Taking Hydrochlorothiazide 25 MG Tablet 1 tablet in the morning  Orally Once a day  Taking Loratadine 10 MG Tablet 1 tablet Orally Once a day  Taking Losartan Potassium 50 mg Tablet 1 tablet Orally bid  Taking Metoprolol Succinate ER 25 mg Tablet Extended Release 24  Hour 1 tablet Orally bid  Taking Metoprolol Tartrate 25 mg Tablet 1 tablet with food Orally  prn  Taking Prevagen Extra Strength 20 MG Capsule Orally  Taking Xarelto 20 MG Tablet 1 tablet with food Orally Once a day  .  PAST MEDICAL HISTORY  .  Paroxysmal Atrial fibrillation  Hypertension  Osteoarthritis  Obesity  Spinal Stenosis  OSA - on CPAP which she uses consistently  .  ALLERGIES  .  ace inhibitors  .  SURGICAL HISTORY  .  Caesarian Section  Rhinoplasty  Knee Replacement  .  FAMILY HISTORY  .  Father Deceased 39 years: CAD, MI, AAAMother: Deceased 32 years,  died of CA: lymphoma, sarcoma with METSSister: CAD, HTN.  .  SOCIAL HISTORY  .  Quit Smoking in 1980sNo Illicit drugs.  Marland Kitchen  REVIEW OF SYSTEMS  .  CardioVascular:  .  General/Constitutional    no fever, chills, night sweats, sleep  disturbance, headaches, no change in appetite, no fatigue, no  weight gain/loss . Gastrointestinal    no melena, no  hematochezia, hematemses, no blood in stool, nausea, vomiting,  diarrhea, abdominal pain . Peripheral Vascular    no  claudication, no  skin ulceration . Endocrine    no polydipsia,  polyphagia, hot and cold intolerance . Respiratory    no cough,  wheezing, shortness of breath at rest, sore throat, upper airway  congestion, or hemoptysis . Genitourinary    no blood in urine .  Musculoskeletal    no arthraglias, myalgias, or muscle aches .  Marland Kitchen  VITAL SIGNS  .  Pain scale 0, Ht-in 65, Wt-lbs 325, BMI 54.08, BP 145/89, HR 61,  SaO2 95%.  .  PHYSICAL EXAMINATION  .  General Examination:  General Appearance  well developed, well nourished, alert,  oriented, in no acute distress. Oral Cavity  mucosa moist. Skin   no rashes. Heart  regular rate and rhythm, normal S1, normal S2.  Lungs  clear to auscultation bilaterally with no wheezing, no  crackles, no rhonchi. Chest  no costochondral tenderness. Abdomen   soft, non tender, non distended, bowel sounds present, no  abdominal or flank bruits, no organomegaly, no prominent  pulsation, no bruits. Neurological  nonfocal, alert and oriented,  no gross focal deficits.  .  ASSESSMENTS  .  Atrial fibrillation - I48.91 (Primary)  .  60 year old female patient with paroxysmal atrial fibrillation  previously controlled on flecainide and metoprolol. She has had  recurrences recently despite compliance with medical therapy.  Mrs. Butkiewicz is a good candidate for pulmonary vein isolation due  to the paroxysmal nature of her highly symptomatic paroxysmal  atrial fibrillation. We had an extensive discussion with her and  with her husband about the rationale behind recommending  pulmonary vein isolation, the risks and the potential benefits.  Unfortunately her morbid obesity would significantly lower the  long-term success of this procedure. In order to improve chances  of a successful ablation we recommend weight loss. She is very  receptive to this and already has a plan in mind including diet  and increased activity. The other mitigating factor against a  long-term successful outcome is obstructive sleep apnea. On  the  other hand, she is compliant with her CPAP which in and of itself  enhances the long-term success of PVI. Were reviewed with both  Mrs. and Mr. Conkle the risks of a stroke, right phrenic nerve  paralysis, vascular injury, injury to the esophagus as well as  musculoskeletal discomfort related to lying on her back during  the procedure and during recovery given her morbid obesity.We  also recommend an ECHO and a stress test since it has been 3  years since she has been on flecainide. This will be done at Dr.  Aundria Mems office. We will see her again towards the end of January  2018. We will tentatively schedule her for an ablation in  February, which, fortuitously, is the  earliest available opening  for an AF ablation with the optimism that she will achieve  meaningful weight loss by then. We would ask her to continue the  Xarelto uninterrupted but hold the flecainide a week before the  anticipated date of the ablation. She will have a TEE and a CT  scan of her heart the day before the procedure.  .  TREATMENT  .  Atrial fibrillation  Continue Flecainide Acetate Tablet, 50 mg, Orally, twice daily  Continue Metoprolol Succinate ER Tablet Extended Release 24 Hour,  25 mg, 1 tablet, Orally, bid  Continue Metoprolol Tartrate Tablet, 25 mg, 1 tablet with food,  Orally, prn  Continue Xarelto Tablet, 20 MG, 1 tablet with food, Orally, Once  a day  .  FOLLOW UP  .  2 Months  .  Electronically signed by Guerry Minors , MD on  08/28/2016 at 11:22 AM EST  .  CONFIRMATORY SIGN OFF  I personally interviewed and examined Mrs. Devita and both Dr. Suzie Portela and I contributed to this electronic note. I agree with the history, exam, assessment and plan as detailed in this note and edited it as necessary. HOMOUD,MUNTHER 08/28/2016 11:21:36 AM >   .  Document electronically signed by Guerry Minors    .

## 2016-08-28 ENCOUNTER — Ambulatory Visit: Admitting: Cardiovascular Disease

## 2016-08-28 ENCOUNTER — Ambulatory Visit (HOSPITAL_BASED_OUTPATIENT_CLINIC_OR_DEPARTMENT_OTHER): Admitting: Cardiovascular Disease

## 2016-08-28 ENCOUNTER — Ambulatory Visit

## 2016-08-28 NOTE — Progress Notes (Signed)
* * *        **  Emma [name]    --- ---    63 Y old Female, DOB: [birth year]    [address], [name], [place], [place] [address]    Home: [phone]    Provider: [name], MD        * * *    Telephone Encounter    ---    Answered by   Carlynn Spry  Date: 08/28/2016         Time: 12:03 PM    Reason   CAll Dr. Malcolm Metro    --- ---            Message                      Pls call Dr. Malcolm Metro [phone]                Action Taken   [name], MD 08/29/2016 5:10:36 PM > will try weight  loss, open to AF ablation.                * * *                ---          * * *          Patient: Emma [name], Emma [name] DOB: [birth year] Provider: [name],  MD 08/28/2016    ---    Note generated by eClinicalWorks EMR/PM Software ([email or web address])

## 2016-09-03 ENCOUNTER — Ambulatory Visit: Admitting: General Acute Care Hospital

## 2016-09-05 LAB — HX .T-SPOT TB
CASE NUMBER: 2017338000555
HX T-SPOT TB: NEGATIVE
HX TB-NILCONT: 0
HX TB-PANEL-A: 0
HX TB-PANEL-B: 0
HX TB-POSITIVECONTROL: >20

## 2016-09-12 ENCOUNTER — Ambulatory Visit: Admitting: Cardiovascular Disease

## 2016-09-12 NOTE — Progress Notes (Signed)
* * *         [facility]**        ---    Lawernce Ion. Sindy Messing, MD [facility] Kieth Brightly. Donnetta Simpers, MD [facility] Barbera Setters Byrd Hesselbach, MD  [facility];    Darlyn Chamber, MD [facility] Roosvelt Maser, MD [facility] Shanon Brow, MD [facility]  Nile Dear. Audley Hose, MD [facility]    Kandis Mannan A. Karie Mainland, MD [facility] Johnell Comings. MacNaught, MD [facility] Mitchel Honour, MD Erma Pinto, MD [facility]    Colletta Maryland, MD Weston Anna, MD [facility] Kerby Moors, NP Maximino Greenland ,  NP        * * *     **Patient Name:** Emma [name]   **Date:** 09/12/2016    --- ---     **DOB:** [birth year]     **Referring Provider:** Mardene Sayer   **Appointment Provider:**  [name], MD        * * *    09/12/2016  Progress Notes: [name], MD    --- ---    ---        Current Medications    ---    Taking     * Xarelto 20 mg tablet 1 tab(s) orally Q.D.    ---    * flecainide 50 mg tablet 1 tab(s) orally B.I.D.    ---    * hydrochlorothiazide 25 mg tablet 1 tab(s) orally once a day    ---    * Metoprolol Tartrate 25 mg tablet 1 tab(s) orally orally once daily as needed for episodes of atrial fibrillation    ---    * loratadine 10 mg tablet 1 tab(s) orally once a day    ---    * losartan 50 mg tablet 1 tab(s) orally BID    ---    * Metoprolol Succinate ER 25 mg tablet, extended release 1 tab(s) orally BID    ---    * Tylenol Extra Strength 500 mg tablet 2 tab(s) orally PRN    ---    * Medication List reviewed and reconciled with the patient    ---      Past Medical History    ---      PAF    ---    HTN    ---    Obesity    ---    Osteoarthritis    ---    Spinal stenosis    ---      Surgical History    ---      C section    ---    rhinoplasty    ---    knee replacement    ---      Family History    ---      Father: deceased 70 yrs, CAD, MI, AAA    ---    Mother: deceased 61 yrs, Died of CA, initially lymphoma then sarcomas in leg  with mets    ---    Siblings: sister- HTN, CAD    ---      Social History    ---    Tobacco Discontinued: 1980s,  Status: former smoker, How long has it been since  you last smoked? > 10 years, Patient counseled on the dangers of tobacco use:  03/29/2016.    Advised to lose weight: Yes Discussed elevated BMI with the patient and  importance of losing weight for cardiovascular and general overall health,  Discussed elevated BMI with the  patient and importance of losing weight for  cardiovascular and general overall health.    No Presence of Falls .    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    ---      Allergies    ---      ace inhibitors    ---      Review of Systems    ---     _._ :    no Change in Vision. no Major change in weight. no Nausea. no vomitting. no  Diarrhea. no hematemesis. no Blood in urine. no blood in stool. no black,  tarry stool. no focal weakness, numbness or tingling. no rashes. no  hallucinations. no fevers, chills. no decrease in appetite. no Intrascapular  pain. no Bruising. no abdominal pain.            Reason for Appointment    ---      1\. HFU AF    ---      History of Present Illness    ---     _MSSP_ :    60 year old with a history of PAF, hypertension, and obesity, followed by Dr.  Ruffin Frederick, who presents for followup. She was last seen 3 months ago. Since her  last visit, hospitalized last month with atrial fibrillation. Attempted  chemical cardioversion with flecainide leading to junctional bradycardia and  requiring transient dopamine. Spontaneously converted and sent home. Had  follow-up with Dr. Malcolm Metro at [facility] and pulmonary vein isolation is tentatively  scheduled for March. She was instructed to pursue significant weight loss.  Otherwise feeling well and reports no significant issues at this time and  denies chest pain or dyspnea nausea vomiting diaphoresis PND orthopnea  lightheadedness palpitations or syncope.      Vital Signs    ---    HR 55, BP 128/72, Wt 333, Ht 65, BMI 55.41, Med Assist: sm.      Examination    ---     _HS_ :    General: pleasant, obese, middle  aged female, in no distress. ENMT EOMI,  anicteric, MMM, OP clear. Neck: no JVD, bruits, or lymphadenopathy. Pulmonary  System clear to auscultation bilaterally, no wheezes or crackles. Cardiac: PMI  is nondisplaced, normal S1 S2, regular rate, no murmur, gallop or pericardial  rub. GI SYSTEM obese, soft, NT/ND, BS present, no guarding or rigidity.  Extremities no clubbing, no edema, + lymphedema. Peripheral pulses: normal  (2+) bilaterally. Skin: normal, no rash. Neurologic exam: grossly non-focal.  Pyschiatric appropriate.          Data    ---     _EKG (reviewed personally)_ :    09/12/16: sinus rhythm, 55.    _Lipids_ :    4/16: TC 214 TRI 103 HDL 60 LDL 133. 3/15: TC 214, TG 68, HDL 58, LDL 142.    _Lexiscan Myoview_ :    2012: normal.          Impression/Recommendations    ---       **1\. Paroxysmal atrial fibrillation**    _IMAGING: **Echocardiogram (Ordered for 09/12/2016)_    _IMAGING: **ETT-Myoview 2 day protocol (Ordered for 09/12/2016)_    Clinical Notes: Remains on flecainide therapy for now. We discussed the  possibility of using an alternative agent such as sotalol but she still wants  to consider PVI for now. We'll have her obtain stress andechocardiogram for  further evaluation.    ---        **  2\. Essential (primary) hypertension**    Notes: Patient Educated with: AHANutritionSheet.pdf (AHANutritionSheet.pdf).    Clinical Notes: Normotensive and stable current regimen.        **3\. Hypercholesterolemia**    Clinical Notes: Strongly encouraged for weight loss.        Diagnostic Imaging    --- ---    Ardine Bjork: **EKG_    ---       Procedure Codes    ---      93000 IH    ---      Follow Up    ---    end of february    Electronically signed by Homero Fellers MD on 09/12/2016 at 04:00 PM EST    Sign off status: Completed        * * [facility]    [address]    [place], [place] [address]    Tel: [phone]    Fax: [phone]              * * *          Patient:  SKYA, MCCULLUM DOB: 02-07-1956 Progress Note: [name], MD 09/12/2016    ---    Note generated by eClinicalWorks EMR/PM Software ([email or web address])

## 2016-09-14 ENCOUNTER — Ambulatory Visit: Admitting: Cardiovascular Disease

## 2016-09-14 NOTE — Progress Notes (Signed)
* * *        **  Emma [name]    --- ---    51 Y old Female, DOB: 05-09-1956    [address], [name], [place], [place] [address]    Home: [phone]    Provider: [name], MD        * * *    Telephone Encounter    ---    Answered by   Rolena Infante  Date: 09/14/2016         Time: 02:32 PM    Reason   chg time to 3pm    --- ---            Message                      LMOM to change time to arrive at [facility] as there was already [name] patient scheduled to come at 1:15...  tried to call cell phone but wron number is entered                    * * *                ---          * * *          Patient: Emma [name], Emma [name] DOB: Apr 02, 1956 Provider: [name],  MD 09/14/2016    ---    Note generated by eClinicalWorks EMR/PM Software ([email or web address])

## 2016-09-19 ENCOUNTER — Ambulatory Visit: Admitting: Cardiovascular Disease

## 2016-09-19 NOTE — Progress Notes (Signed)
* * *        **  Emma [name]    --- ---    6 Y old Female, DOB: 1956/01/29    [address], [name], [place], [place] [address]    Home: [phone]    Provider: [name], MD        * * *    Telephone Encounter    ---    Answered by   Rolena Infante  Date: 09/19/2016         Time: 01:14 PM    Reason    HOLD METOPROLOL??    --- ---            Message                      You have ordered [name] 2 day ett-myoview on this patient with PAF.. she is on metoprolol, should she hold or take med for test and if on med and does not reach HR, change to lexi?  Please advise.Bartholomew Boards, ML                Action Taken   [name], MD 09/19/2016 5:06:19 PM > yes hold  Rolena Infante 09/20/2016 6:59:53 AM > ty                * * *                ---          * * *          Patient: Emma [name], Emma [name] DOB: 10/22/1955 Provider: [name],  MD 09/19/2016    ---    Note generated by eClinicalWorks EMR/PM Software ([email or web address])

## 2016-10-12 ENCOUNTER — Ambulatory Visit

## 2016-10-12 NOTE — Progress Notes (Signed)
**  Progress Notes**    ---    **Patient:** Emma [name], Emma [name]     **Account Number:** [id number]  **Provider:** [facility]     **DOB:** 02-06-1956 **Age:** 71 Y **Sex:** Female  **Date:** 10/12/2016     **Phone:** [phone]     **Address:** [address], [place], [place]     **Pcp:** MARIANA CHEMALY        * * *         **Subjective:**        ---      **Chief Complaints:**    ------      1\. Per dr hong;r/s by pt.    ------     **Medical History:** PAF, HTN, Obesity, Osteoarthritis, Spinal stenosis.    ------     **Medications:** None    ------      **Allergies:** Ace Inhibitors.    ------        **Objective:**        ---         **Assessment:**        ---      **Assessment:**    1\. Paroxysmal atrial fibrillation - I48.0 (Primary)    2\. Essential Hypertension - I10    ------        **Plan:**        ---       **1\. Paroxysmal atrial fibrillation**    _Imaging: **Echocardiogram_    ---      **Procedure Codes:** [phone] TRANSTHORACIC ECHO, COMPLETE W/DOPPLER & COLOR    ------          **Provider:** [facility]    ---     **Patient:** ASHYIA, SCHRAEDER A **DOB:** December 12, 1955 **Date:** 10/12/2016    ---    Electronically signed by Casimiro Needle Ready on 10/12/2016 at 05:35 PM EST    Sign off status: Completed

## 2016-11-05 ENCOUNTER — Ambulatory Visit

## 2016-11-12 ENCOUNTER — Ambulatory Visit: Admitting: Cardiovascular Disease

## 2016-11-12 NOTE — Progress Notes (Signed)
* * *        **  Emma [name]    --- ---    21 Y old Female, DOB: [birth year]    [address], [name], [place], [place] [address]    Home: [phone]    Provider: [name], MD        * * *    Web Encounter    ---    Answered by   Elicia Lamp  Date: 11/12/2016         Time: 02:02 PM    Caller   Pt    --- ---            Reason   2 day myoview            Message                      Pt had to cx Feb 13,14 2 day nuc appt. Can Tam help pt to rebook to make sure it's booked correctly? Thank you.       Pt [phone]                Action Taken   Dinh,Tam 11/13/2016 5:01:16 PM > S/W PT, WILL CALL BACK WITH  MARCH WORK SCHED, PT COULD NOT COORDINATE APPT WITH FEB SCHED. Dinh,Tam  11/27/2016 11:40:50 AM > pt booked                * * *                ---          * * *          Patient: Emma [name], Emma [name] DOB: June 01, 1956 Provider: [name],  MD 11/12/2016    ---    Note generated by eClinicalWorks EMR/PM Software ([email or web address])

## 2016-11-15 ENCOUNTER — Ambulatory Visit: Admitting: Internal Medicine

## 2016-12-05 ENCOUNTER — Ambulatory Visit

## 2016-12-18 ENCOUNTER — Ambulatory Visit

## 2016-12-25 ENCOUNTER — Ambulatory Visit

## 2016-12-31 ENCOUNTER — Ambulatory Visit

## 2016-12-31 NOTE — Progress Notes (Signed)
**  Progress Notes**    ---    **Patient:** Emma [name], Emma [name]     **Account Number:** [id number]  **Provider:** [facility]     **DOB:** 1956-07-12 **Age:** 47 Y **Sex:** Female  **Date:** 12/31/2016     **Phone:** [phone]     **Address:** [address], [name], [place], [place]     **Pcp:** MARIANA CHEMALY        * * *         **Subjective:**        ---      **Chief Complaints:**    ------      1\. Arr 9:15.Marland KitchenMarland KitchenDO NOT BUMP...chg to lexi if not reach HR.    ------     **Medical History:** PAF, HTN, Obesity, Osteoarthritis, Spinal stenosis.    ------     **Medications:** None    ------      **Allergies:** Ace Inhibitors.    ------        **Objective:**        ---         **Assessment:**        ---      **Assessment:**    1\. Paroxysmal atrial fibrillation - I48.0 (Primary)    ------        **Plan:**        ---       **1\. Paroxysmal atrial fibrillation**    _Imaging: **Adenosine/Lexiscan Myoview_    _Imaging: **ETT-Myoview 2 day protocol_    ---      **Procedure Codes:** U9811 MYOVIEW 38M TETROFOSMIN, 91478 CARDIOVASCULAR  STRESS TEST, 78452 SPECT, MULTIPLE, G9562 INJECTION REGADENOSON 0.1 MG    ------          **Provider:** [facility]    ---     **Patient:** Emma [name], Emma [name] **DOB:** December 25, 1955 **Date:** 12/31/2016    ---    Electronically signed by Koren Shiver on 12/31/2016 at 11:10 AM EDT    Sign off status: Completed

## 2017-01-01 ENCOUNTER — Ambulatory Visit: Admitting: Cardiovascular Disease

## 2017-01-01 NOTE — Progress Notes (Signed)
* * *        **  Emma [name]    --- ---    50 Y old Female, DOB: 1956/01/23    [address], [name], [place], [place] [address]    Home: [phone]    Provider: [name], MD        * * *    Telephone Encounter    ---    Answered by   Mary Sella  Date: 01/01/2017         Time: 07:51 AM    Reason   no show .    --- ---            Message                      r/s from 3/22                Action Taken   Carlson,Dyann 01/01/2017 2:35:41 PM > LMOM to return my call  Carlson,Dyann 01/01/2017 2:41:40 PM > Pt. does not want to make an appt. at this  time.                * * *                ---          * * *          Patient: Emma [name], Emma [name] DOB: 07-05-1956 Provider: [name],  MD 01/01/2017    ---    Note generated by eClinicalWorks EMR/PM Software ([email or web address])

## 2017-01-02 ENCOUNTER — Ambulatory Visit: Admitting: Cardiovascular Disease

## 2017-01-02 NOTE — Progress Notes (Signed)
* * *        **  Emma [name]    --- ---    81 Y old Female, DOB: [birth year]    [address], [place], [place] [address]    Home: [phone]    Provider: [name], MD        * * *    Telephone Encounter    ---    Answered by   Homero Fellers D  Date: 01/02/2017         Time: 06:48 PM    Message                      call re: stress test        --- ---            Action Taken   [name], MD 01/03/2017 5:17:28 PM > please schedule  catheterization with Dr Karie Mainland re: abnormal ETT Emma [name] 01/04/2017 10:30:44 AM  > made procedure appt called pt LMOM                * * *              * * *        ---        Current Medications    ---      None    ---      Past Medical History    ---      PAF    ---    HTN    ---    Obesity    ---    Osteoarthritis    ---    Spinal stenosis    ---      Allergies    ---      ace inhibitors    ---      Assessments    ---    1\. Abnormal stress test with nuclear imaging - R94.30 (Primary)    ---      Impression/Recommendations    ---       **1\. Abnormal stress test with nuclear imaging**    _LAB: [facility] Orders: CBC/PT/PTT/BMP/EKG (Ordered for 01/04/2017)_    *Cardiac Catheterization: Left heart (Ordered for 01/03/2017)[id number]     ---          * * *          Patient: Emma [name], Emma [name] DOB: 10-Jan-1956 Provider: [name],  MD 01/02/2017    ---    Note generated by eClinicalWorks EMR/PM Software ([email or web address])

## 2017-01-03 ENCOUNTER — Ambulatory Visit: Admitting: Cardiovascular Disease

## 2017-01-04 ENCOUNTER — Ambulatory Visit: Admitting: Cardiovascular Disease

## 2017-01-07 ENCOUNTER — Ambulatory Visit: Admitting: Cardiovascular Disease

## 2017-01-07 LAB — HX BASIC METABOLIC PANEL
CASE NUMBER: 2018099000647
HX ANION GAP: 8 — NL (ref 3.0–11.0)
HX BUN: 16 mg/dL — NL (ref 7.0–18.0)
HX CALCIUM LVL: 9.5 mg/dL — NL (ref 8.5–10.1)
HX CHLORIDE: 106 mmol/L — NL (ref 98.0–110.0)
HX CO2: 28 mmol/L — NL (ref 21.0–32.0)
HX CREATININE: 0.597 mg/dL — NL (ref 0.55–1.3)
HX GLUCOSE LVL: 86 mg/dL — NL (ref 65.0–110.0)
HX POTASSIUM LVL: 3.7 mmol/L — NL (ref 3.6–5.2)
HX SODIUM LVL: 142 mmol/L — NL (ref 136.0–145.0)

## 2017-01-07 LAB — HX PT
CASE NUMBER: 2018099000647
HX INR: 1.2
HX PT: 13.4 s — ABNORMAL HIGH (ref 9.3–11.6)

## 2017-01-07 LAB — HX PTT
CASE NUMBER: 2018099000647
HX PTT: 36.5 s — ABNORMAL HIGH (ref 23.4–32.1)

## 2017-01-07 LAB — HX GLOMERULAR FILTRATION RATE (ESTIMATED)
CASE NUMBER: 2018099000647
HX AFN AMER GLOMERULAR FILTRATION RATE: >90
HX NON-AFN AMER GLOMERULAR FILTRATION RATE: >90

## 2017-01-07 LAB — HX CBC W/ INDICES
CASE NUMBER: 2018099000647
HX HCT: 42.3 % — NL (ref 36.0–47.0)
HX HGB: 13.8 g/dL — NL (ref 11.8–15.8)
HX MCH: 30 pg — NL (ref 27.0–31.0)
HX MCHC: 32.6 g/dL — NL (ref 32.0–36.0)
HX MCV: 91 fL — NL (ref 81.0–99.0)
HX MPV: 9.8 fL — NL (ref 7.4–11.5)
HX NRBC PERCENT: 0 % — NL
HX PLATELET: 334 10*3/uL — NL (ref 150.0–400.0)
HX RBC: 4.65 10*6/uL — NL (ref 3.6–5.0)
HX RDW: 13.4 % — NL (ref 11.5–14.5)
HX WBC: 9.1 10*3/uL — NL (ref 3.7–11.2)

## 2017-01-07 LAB — HX PSC EKG LAB: CASE NUMBER: 2018099000647

## 2017-01-08 ENCOUNTER — Ambulatory Visit: Admitting: Cardiovascular Disease

## 2017-01-09 ENCOUNTER — Ambulatory Visit

## 2017-01-16 ENCOUNTER — Ambulatory Visit

## 2017-01-17 ENCOUNTER — Ambulatory Visit: Admitting: Cardiovascular Disease

## 2017-01-17 LAB — HX BF-URINALYSIS
HX KETONES: NEGATIVE mg/dL
HX NITRITE LEVEL: NEGATIVE
HX RED BLOOD CELLS: 6 /HPF — AB
HX SPECIFIC GRAVITY: 1.02
HX U BILIRUBIN: NEGATIVE
HX U BLOOD: NEGATIVE
HX U GLUCOSE: NEGATIVE mg/dL
HX U PH: 7
HX U PROTEIN: NEGATIVE mg/dL
HX U UROBILINIG: 0.2 EU
HX WHITE BLOOD CELLS: 4 /HPF

## 2017-01-17 LAB — HX HEM-ROUTINE
HX BASO #: 0 10*3/uL (ref 0.0–0.2)
HX BASO: 0 %
HX EOSIN #: 0.4 10*3/uL (ref 0.0–0.5)
HX EOSIN: 6 %
HX HCT: 41 % (ref 32.0–45.0)
HX HGB: 13.5 g/dL (ref 11.0–15.0)
HX IMMATURE GRANULOCYTE#: 0 10*3/uL (ref 0.0–0.1)
HX IMMATURE GRANULOCYTE: 0 %
HX LYMPH #: 1.9 10*3/uL (ref 1.0–4.0)
HX LYMPH: 31 %
HX MCH: 30 pg (ref 26.0–34.0)
HX MCHC: 32.9 g/dL (ref 32.0–36.0)
HX MCV: 91.1 fL (ref 80.0–98.0)
HX MONO #: 0.5 10*3/uL (ref 0.2–0.8)
HX MONO: 8 %
HX MPV: 9.4 fL (ref 9.1–11.7)
HX NEUT #: 3.4 10*3/uL (ref 1.5–7.5)
HX PLT: 251 10*3/uL (ref 150–400)
HX RBC BLOOD COUNT: 4.5 M/uL (ref 3.70–5.00)
HX RDW: 13.6 % (ref 11.5–14.5)
HX SEG NEUT: 54 %
HX WBC: 6.3 10*3/uL (ref 4.0–11.0)

## 2017-01-17 LAB — HX CHEM-PANELS
HX ANION GAP: 9 (ref 5–18)
HX BLOOD UREA NITROGEN: 23 mg/dL (ref 6–24)
HX CHLORIDE (CL): 106 meq/L (ref 98–110)
HX CO2: 29 meq/L (ref 20–30)
HX CREATININE (CR): 0.69 mg/dL (ref 0.57–1.30)
HX GFR, AFRICAN AMERICAN: 110 mL/min/{1.73_m2}
HX GFR, NON-AFRICAN AMERICAN: 95 mL/min/{1.73_m2}
HX POTASSIUM (K): 4.5 meq/L (ref 3.6–5.1)
HX SODIUM (NA): 144 meq/L (ref 135–145)

## 2017-01-17 LAB — HX COAGULATION
HX INR PT: 1.6 — ABNORMAL HIGH (ref 0.9–1.3)
HX PROTHROMBIN TIME: 18.2 s — ABNORMAL HIGH (ref 9.7–14.0)
HX PTT: 43.6 s — ABNORMAL HIGH (ref 25.7–35.7)

## 2017-01-17 LAB — HX TRANSFUSION
HX ABO-RH (TYPE ONLY): O POS
HX ANTIBODY SCREEN (GEL): NEGATIVE

## 2017-01-18 ENCOUNTER — Inpatient Hospital Stay
Admit: 2017-01-18 | Disposition: A | Discharge status: Home / Self Care | Source: Ambulatory Visit | Attending: Cardiovascular Disease | Admitting: Cardiovascular Disease

## 2017-01-18 LAB — HX TRANSFUSION: HX ABO RH CONFIRMATORY TYPE, INSTRUMENT: O POS

## 2017-01-18 NOTE — Progress Notes (Signed)
Division of Cardiology  Patient Name:    Emma [name], Emma [name]  Medical Record   [id number]  #:  Date of Birth:   [birth year]  Visit Date:      01/24/2017  .  Marland Kitchen  Homero Fellers, MD  [facility]  3rd Floor, [address]  [place], [place] [address]  .  RE:  Emma [name], Emma [name]  Date of Visit:  01/21/2017  .  Dear Dr. Audley Hose:  .  Ms. 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  After failing flecainide, she was referred for ablation of her  paroxysmal atrial fibrillation.  The risk factors she has are obesity,  sleep apnea, and hypertension.  .  On 01/18/2017, she underwent pulmonary vein isolation using cryoablation.  There was some difficulty in isolating the left superior pulmonary vein.  Even after it was isolated pacing or mechanical trauma to the left atrium  led to the precipitation of atrial fibrillation.  In light of this, she was  advised to resume the flecainide that had been held prior to the ablation,  albeit at higher dose.  She had been on 50 mg twice a day, she was  hospitalized for 2 days during which her dose of flecainide was raised to  100 mg twice a day.  She tolerated this well and in fact converted  spontaneously back into sinus rhythm after organizing into an atrial  flutter.  .  It will be difficult to determine the impact PVI alone will have on her.  Prior to this ablation, she would have multiple, highly symptomatic  breakthroughs on flecainide 50 mg twice a day.  I hope and anticipate that  with a higher dose of flecainide and following the pulmonary vein  isolation, the episodes will be few and far in between.  This does not  closed the door for another procedure during which we would look for an  extra pulmonary focus for her atrial fibrillation.  .  I would like to take this opportunity to thank you for allowing me to  participate in the care of your patient and if I  can be of any further  assistance in her management, please do not hesitate to contact me.  .  With Best Regards,  .  Munther Homoud, MD  .  .  .  Sincerely,  .  .  Guerry Minors, MD, Cardiology Attending  Dictated by:  Guerry Minors, MD  .  Electronically Signed  Guerry Minors, MD 01/26/2017 08:49 A  .  .  .  cc:

## 2017-01-19 LAB — HX HEM-ROUTINE
HX BASO #: 0 10*3/uL (ref 0.0–0.2)
HX BASO: 0 %
HX EOSIN #: 0 10*3/uL (ref 0.0–0.5)
HX EOSIN: 0 %
HX HCT: 38.7 % (ref 32.0–45.0)
HX HGB: 12.4 g/dL (ref 11.0–15.0)
HX IMMATURE GRANULOCYTE#: 0 10*3/uL (ref 0.0–0.1)
HX IMMATURE GRANULOCYTE: 0 %
HX LYMPH #: 1.4 10*3/uL (ref 1.0–4.0)
HX LYMPH: 17 %
HX MCH: 29.5 pg (ref 26.0–34.0)
HX MCHC: 32 g/dL (ref 32.0–36.0)
HX MCV: 91.9 fL (ref 80.0–98.0)
HX MONO #: 0.7 10*3/uL (ref 0.2–0.8)
HX MONO: 8 %
HX MPV: 9.5 fL (ref 9.1–11.7)
HX NEUT #: 6.2 10*3/uL (ref 1.5–7.5)
HX PLT: 249 10*3/uL (ref 150–400)
HX RBC BLOOD COUNT: 4.21 M/uL (ref 3.70–5.00)
HX RDW: 14.3 % (ref 11.5–14.5)
HX SEG NEUT: 74 %
HX WBC: 8.4 10*3/uL (ref 4.0–11.0)

## 2017-01-19 LAB — HX CHEM-PANELS
HX ANION GAP: 6 (ref 5–18)
HX BLOOD UREA NITROGEN: 14 mg/dL (ref 6–24)
HX CHLORIDE (CL): 109 meq/L (ref 98–110)
HX CO2: 26 meq/L (ref 20–30)
HX CREATININE (CR): 0.61 mg/dL (ref 0.57–1.30)
HX GFR, AFRICAN AMERICAN: 115 mL/min/{1.73_m2}
HX GFR, NON-AFRICAN AMERICAN: 99 mL/min/{1.73_m2}
HX GLUCOSE: 108 mg/dL (ref 70–139)
HX POTASSIUM (K): 4.2 meq/L (ref 3.6–5.1)
HX SODIUM (NA): 141 meq/L (ref 135–145)

## 2017-01-19 LAB — HX COAGULATION
HX INR PT: 2 — ABNORMAL HIGH (ref 0.9–1.3)
HX PROTHROMBIN TIME: 23.7 s — ABNORMAL HIGH (ref 9.7–14.0)
HX PTT: 41.2 s — ABNORMAL HIGH (ref 25.7–35.7)

## 2017-01-19 LAB — HX CHEM-OTHER
HX CALCIUM (CA): 8.8 mg/dL (ref 8.5–10.5)
HX MAGNESIUM: 1.9 mg/dL (ref 1.6–2.6)
HX PHOSPHORUS: 3 mg/dL (ref 2.7–4.5)

## 2017-01-19 LAB — HX DIABETES: HX GLUCOSE: 108 mg/dL (ref 70–139)

## 2017-01-20 LAB — HX COAGULATION
HX INR PT: 1.9 — ABNORMAL HIGH (ref 0.9–1.3)
HX PROTHROMBIN TIME: 22.1 s — ABNORMAL HIGH (ref 9.7–14.0)
HX PTT: 39.6 s — ABNORMAL HIGH (ref 25.7–35.7)

## 2017-01-20 LAB — HX DIABETES: HX GLUCOSE: 106 mg/dL (ref 70–139)

## 2017-01-20 LAB — HX CHEM-PANELS
HX ANION GAP: 8 (ref 5–18)
HX BLOOD UREA NITROGEN: 16 mg/dL (ref 6–24)
HX CHLORIDE (CL): 110 meq/L (ref 98–110)
HX CO2: 25 meq/L (ref 20–30)
HX CREATININE (CR): 0.63 mg/dL (ref 0.57–1.30)
HX GFR, AFRICAN AMERICAN: 113 mL/min/{1.73_m2}
HX GFR, NON-AFRICAN AMERICAN: 98 mL/min/{1.73_m2}
HX GLUCOSE: 106 mg/dL (ref 70–139)
HX POTASSIUM (K): 3.9 meq/L (ref 3.6–5.1)
HX SODIUM (NA): 143 meq/L (ref 135–145)

## 2017-01-20 LAB — HX CHEM-OTHER
HX CALCIUM (CA): 8.6 mg/dL (ref 8.5–10.5)
HX MAGNESIUM: 1.9 mg/dL (ref 1.6–2.6)
HX PHOSPHORUS: 2.5 mg/dL — ABNORMAL LOW (ref 2.7–4.5)

## 2017-01-21 LAB — HX HEM-ROUTINE
HX BASO #: 0 10*3/uL (ref 0.0–0.2)
HX BASO: 0 %
HX EOSIN #: 0.4 10*3/uL (ref 0.0–0.5)
HX EOSIN: 4 %
HX HCT: 37.5 % (ref 32.0–45.0)
HX HGB: 12.1 g/dL (ref 11.0–15.0)
HX IMMATURE GRANULOCYTE#: 0 10*3/uL (ref 0.0–0.1)
HX IMMATURE GRANULOCYTE: 0 %
HX LYMPH #: 2 10*3/uL (ref 1.0–4.0)
HX LYMPH: 22 %
HX MCH: 29.8 pg (ref 26.0–34.0)
HX MCHC: 32.3 g/dL (ref 32.0–36.0)
HX MCV: 92.4 fL (ref 80.0–98.0)
HX MONO #: 1 10*3/uL — ABNORMAL HIGH (ref 0.2–0.8)
HX MONO: 10 %
HX MPV: 9.5 fL (ref 9.1–11.7)
HX NEUT #: 6 10*3/uL (ref 1.5–7.5)
HX PLT: 239 10*3/uL (ref 150–400)
HX RBC BLOOD COUNT: 4.06 M/uL (ref 3.70–5.00)
HX RDW: 14.4 % (ref 11.5–14.5)
HX SEG NEUT: 64 %
HX WBC: 9.4 10*3/uL (ref 4.0–11.0)

## 2017-01-21 LAB — HX CHEM-PANELS
HX ANION GAP: 8 (ref 5–18)
HX BLOOD UREA NITROGEN: 15 mg/dL (ref 6–24)
HX CHLORIDE (CL): 103 meq/L (ref 98–110)
HX CO2: 27 meq/L (ref 20–30)
HX CREATININE (CR): 0.67 mg/dL (ref 0.57–1.30)
HX GFR, AFRICAN AMERICAN: 111 mL/min/{1.73_m2}
HX GFR, NON-AFRICAN AMERICAN: 96 mL/min/{1.73_m2}
HX GLUCOSE: 103 mg/dL (ref 70–139)
HX POTASSIUM (K): 3.7 meq/L (ref 3.6–5.1)
HX SODIUM (NA): 138 meq/L (ref 135–145)

## 2017-01-21 LAB — HX CHEM-OTHER
HX CALCIUM (CA): 9 mg/dL (ref 8.5–10.5)
HX MAGNESIUM: 2 mg/dL (ref 1.6–2.6)
HX PHOSPHORUS: 3.1 mg/dL (ref 2.7–4.5)

## 2017-01-21 LAB — HX DIABETES: HX GLUCOSE: 103 mg/dL (ref 70–139)

## 2017-01-21 LAB — HX TRANSFUSION
HX ABO-RH (TYPE ONLY): O POS
HX ANTIBODY SCREEN GEL, MANUAL: NEGATIVE

## 2017-01-21 LAB — HX COAGULATION
HX INR PT: 2 — ABNORMAL HIGH (ref 0.9–1.3)
HX PROTHROMBIN TIME: 23.4 s — ABNORMAL HIGH (ref 9.7–14.0)
HX PTT: 40.5 s — ABNORMAL HIGH (ref 25.7–35.7)

## 2017-01-22 ENCOUNTER — Ambulatory Visit: Admitting: Internal Medicine

## 2017-01-22 NOTE — Progress Notes (Signed)
* * *        Emma [name]**    ------    61 Y old Female, DOB: 01-04-1956    [address] [place], [place], [place] [address]    Home: [phone]    Provider: Mardene Sayer        * * *    Web Encounter    ---    Answered by    Consuelo Pandy    Date: 01/22/2017        Time: 02:02 PM    Reason    Transition of Care-LVM    ------            Message                      admit to [facility], can we get hospital notes and d/c info ,  pt should have hospital f/u. thanks.       * Transition of Care            Admission Date, 01/18/17.  Discharge Date, 01/21/17.  Diagnosis:, Afib with RVR, Pericarditis, Hypertension, OSA.  Medication reviewed with:, patient.  Hospital F/U:, 02/01/17.                       Action Taken    Mansur,Tarina 01/22/2017 2:02:40 PM > Blenda Nicely  01/22/2017 2:28:15 PM > Faxed [facility] for records. LM for patient to call to book  OV. Tilton,Shannon 01/23/2017 11:15:32 AM > Patient called...she was discharged  on Monday, has hospital f/u with TM on 02/01/17 Geoffroy,Jenn 01/23/2017 11:57:49  AM > Medication list updated. Pt aware. TM-FYI Mansur,Tarina 01/23/2017  12:52:53 PM > pls make sure we have info from . thanks. Geoffroy,Jenn  01/23/2017 4:43:03 PM > See Pt docs. Reveiw and let me know if you need  anything else. Mansur,Tarina 01/23/2017 5:38:47 PM > thanks.                * * *              * * *        ---        Reason for Appointment    ---      1\. Transition of Care-LVM    ---      Current Medications    ---    Taking      * flecainide 100 mg tablet 1 tab(s) orally BID     ---    * Tylenol Extra Strength 500 mg tablet 2 tab(s) orally every 6 hours prn     ---    * Xarelto 20 mg tablet 1 tab(s) orally once a day (in the evening)     ---    * losartan 50 mg tablet 1 tab(s) orally BID, Notes: Held by Keane Scrape MD until PCP visit     ---    * Metoprolol Tartrate 50 mg tablet 1 tab(s) orally Twice a day     ---    * Lasix 40 mg tablet 1 tab(s) orally once a day      ---    * loperamide-simethicone 2 mg-125 mg tablet 1 tab(s) orally Daily as needed for diarrhea     ---    * loratadine 10 mg tablet 1 tab(s) orally once a day     ---    * colchicine 0.6 mg tablet 1 tab(s) orally once a day for 14 days     ---  Discontinued    * hydrochlorothiazide 25 mg tablet 1 tab(s) orally once a day     ---    * metoprolol 25 mg tablet 1 tab(s) orally 2 times a day     ---    * Medication List reviewed and reconciled with the patient     ---        **Medical History:**   Paroxysmal Atrial Firbrilation Dr Audley Hose .    ---    HTN .    ---    Obesity .    ---    O/A .    ---    hx of depression she was on Zoloft.    ---    Spinal Stenosis .    ---    Colonoscopy at age 44 normal . repeat at age 64.    ---    A. Fib. admit 08/2016.    ---      Allergies    ---      Ace inhibitors: cough: Allergy    ---          * * *          Patient: Emma [name], Emma [name] DOB: [birth year] Provider: Mardene Sayer  01/22/2017    ---    Note generated by eClinicalWorks EMR/PM Software ([email or web address])

## 2017-01-24 ENCOUNTER — Ambulatory Visit: Admitting: Cardiovascular Disease

## 2017-01-24 NOTE — Progress Notes (Signed)
* * *        **  Emma [name]    --- ---    47 Y old Female, DOB: 02/08/1956    [address] [place], [place] [address]    Home: [phone]    Provider: [name], MD        * * *    Web Encounter    ---    Answered by   Elicia Lamp  Date: 01/24/2017         Time: 10:26 AM    Caller   Pt    --- ---            Reason   Labs needed after recent cath?            Message                      Pt had a cath done with you on 01/08/17. Pt says she supposed to have labs done before f/u with [facility] on 04/30, but she lost the lab slip. Can you recall what you wanted her to get done? she thinks it was a BMP.       pt [phone]                Action Taken   Mariel Aloe, MD 01/24/2017 5:22:56 PM > it was a BMP.  Thanks. KJM Sneed,Felicia 01/25/2017 10:45:31 AM > Order faxed to pt service  center in [place] per pt, [phone].                * * *              * * *        ---        Reason for Appointment    ---      1\. Labs needed after recent cath?    ---      Current Medications    ---      None    ---      Past Medical History    ---      PAF    ---    HTN    ---    Obesity    ---    Osteoarthritis    ---    Spinal stenosis    ---      Allergies    ---      ace inhibitors    ---      Assessments    ---    1\. Essential (primary) hypertension - I10 (Primary)    ---      Impression/Recommendations    ---       **1\. Essential (primary) hypertension**    _LAB: BMP (Ordered for 01/25/2017)_    ---          * * *           Patient: Emma [name], Emma [name] A DOB: 12/26/1955 Provider: [name],  MD 01/24/2017    ---    Note generated by eClinicalWorks EMR/PM Software ([email or web address])

## 2017-01-25 ENCOUNTER — Ambulatory Visit: Admitting: Cardiovascular Disease

## 2017-01-25 LAB — HX BASIC METABOLIC PANEL
CASE NUMBER: 2018117001779
HX ANION GAP: 11 — NL (ref 3.0–11.0)
HX BUN: 16 mg/dL — NL (ref 7.0–18.0)
HX CALCIUM LVL: 9.6 mg/dL — NL (ref 8.5–10.1)
HX CHLORIDE: 104 mmol/L — NL (ref 98.0–110.0)
HX CO2: 27 mmol/L — NL (ref 21.0–32.0)
HX CREATININE: 0.84 mg/dL — NL (ref 0.55–1.3)
HX GLUCOSE LVL: 92 mg/dL — NL (ref 65.0–110.0)
HX POTASSIUM LVL: 4.2 mmol/L — NL (ref 3.6–5.2)
HX SODIUM LVL: 142 mmol/L — NL (ref 136.0–145.0)

## 2017-01-25 LAB — HX GLOMERULAR FILTRATION RATE (ESTIMATED)
CASE NUMBER: 2018117001779
HX AFN AMER GLOMERULAR FILTRATION RATE: 88 mL/min/{1.73_m2}
HX NON-AFN AMER GLOMERULAR FILTRATION RATE: 76 mL/min/{1.73_m2}

## 2017-01-28 ENCOUNTER — Ambulatory Visit: Admitting: Family

## 2017-01-28 ENCOUNTER — Ambulatory Visit: Admitting: Cardiovascular Disease

## 2017-01-28 NOTE — Progress Notes (Signed)
* * *         [facility]**        ---    Lawernce Ion. Sindy Messing, MD [facility] Kieth Brightly. Donnetta Simpers, MD [facility] Setters Byrd Hesselbach, MD  [facility];    Darlyn Chamber, MD [facility] Roosvelt Maser, MD [facility] Shanon Brow, MD [facility]  Nile Dear. Audley Hose, MD [facility]    Kandis Mannan A. Karie Mainland, MD [facility] Johnell Comings. MacNaught, MD [facility] Mitchel Honour, MD Erma Pinto, MD [facility]    Colletta Maryland, MD Weston Anna, MD [facility] Kerby Moors, NP Maximino Greenland ,  NP        * * *     **Patient Name:** Emma [name]   **Date:** 01/28/2017    --- ---     **DOB:** 1956-04-07     **Referring Provider:** Mardene Sayer   **Appointment Provider:** Shaylah Mcghie, NP        * * *    01/28/2017  Progress Notes: Lajada Janes, NP    --- ---    ---        Current Medications    ---    Taking     * furosemide 40 mg tablet 1 tab(s) orally B.I.D.    ---    * Xarelto 20 mg tablet 1 tab(s) orally Q.D.    ---    * flecainide 100 mg tablet 1 tab(s) orally B.I.D.    ---    * Metoprolol Tartrate 25 mg tablet 1 tab(s) orally PRN for recurrent afib    ---    * loratadine 10 mg tablet 1 tab(s) orally once a day    ---    * Tylenol Extra Strength 500 mg tablet 2 tab(s) orally PRN    ---    * colchicine 0.6 mg tablet 1 tab(s) orally once a day    ---    * Metoprolol Succinate ER 25 mg tablet, extended release 1 tab(s) orally B.I.D.    ---    * Medication List reviewed and reconciled with the patient    ---      Past Medical History    ---      PAF - PVI procedure at [facility] 01/18/17 apparently unsuccessful, flecainide  doubled to 100 bid - postop complicated by pericarditis on colchicine    ---    HTN    ---    Obesity    ---    Osteoarthritis    ---    Spinal stenosis    ---    Fasle positive stress test with normal coronaries on cath 12/2016    ---      Surgical History    ---      C section    ---    rhinoplasty    ---    knee replacement    ---    pulmonary vein isolation - Dr Malcolm [facility] 12/2016    ---      Family History    ---      Father: deceased 63 yrs,  CAD, MI, AAA    ---    Mother: deceased 89 yrs, Died of CA, initially lymphoma then sarcomas in leg  with mets    ---    Siblings: sister- HTN, CAD    ---      Social History    ---    no Tobacco Discontinued: 1980s, Status: former smoker, How long has it been  since you last  smoked? > 10 years, Patient counseled on the dangers of tobacco  use: 03/29/2016.    Advised to lose weight: Yes Discussed elevated BMI with the patient and  importance of losing weight for cardiovascular and general overall health,  Discussed elevated BMI with the patient and importance of losing weight for  cardiovascular and general overall health.    No Have you fallen in the past year?.    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    ---      Allergies    ---      ace inhibitors    ---      Review of Systems    ---    See HPI. Otherwise denies change in vision, headaches, major change in weight.  No nausea, vomiting, diarrhea, abdominal pain. Denies abnormal bleeding, skin  rashes/sores. No new focal weakness, numbness or tingling. Denies polyuria,  polydispia. No fevers, chills, malaise, productive cough, hemoptysis or major  change in appetite.        Reason for Appointment    ---      1\. 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    01/08/17 catheterization performed by Dr. Ronnette Hila showed normal coronary  arteries, diastolic heart failure with elevated left heart filling pressures  and normal LV systolic function. She was started on lasix 40 mg BID and HCTZ  was stopped. She has tolerated this well and f/u labs show normal kidneys and  potassium level.    Since the last visit she was admitted 4/20 to [facility] for an  elective PVI ablation with Dr. Malcolm Metro. There are no  records available to me  today but per her report the procedure failed and they were also unable to  cardiovert her. Her rate control was adjusted during the admission due to  rapid A. fib and she subsequently converted on her own and has been in normal  sinus rhythm symptomatically since. Both flecainide and metoprolol doses were  doubled. Her postop course was apparently complicated by pericarditis and she  has been on colchicine once daily since then for a total of 2 weeks with no  recurrent chest pressure. She did have an echocardiogram before discharge and  does not believe she had any significant pericardial effusions. She goes for  follow up on 5/7. She hopes she doesn't need a 2nd ablation procedure and  doesn't think she would do it anyway after what she went through during this  recent admission.    In terms of her A. fib she remains on flecainide which was doubled to 100 mg  twice a day and metoprolol was also doubled to 50 mg twice a day during [facility]  admission. Since discharge she noted a heart rate around 40 and a funny  feeling in her chest and not feeling right. She reduced metoprolol back to 25  mg twice a day which was her previous dose and reportedly feels better with  HRs in the 50s.    She has not had any recurrent palpitations to signify recurrent A. fib. Prior  to all of this her last episode of A. fib was in  January and prior to that  April 2017. She feels that it has been fairly well controlled on flecainide  and metoprolol combination and wishes she never went ahead with the ablation.  Also doesn't want to consider a different antiarrhythmic at this time. She  hopes to lose a significant amount of weight and get rid of her sleep apnea  and is avoiding all triggers like ETOH, caffeine, etc.    No new complaints such as orthopnea, PND, lower extremity edema. Weight has  been stable. No lightheadedness or syncope.      Vital Signs    ---    HR 52, BP 122/82, Wt 326, Ht 65, BMI 54.24, Med Assist:  dd.      Examination    ---     _HS_ :    General: pleasant, obese, middle aged female, in no distress. ENMT EOMI,  anicteric, MMM. Neck: no JVD, bruits. Pulmonary System clear to auscultation  bilaterally, no wheezes or crackles. Cardiac: PMI is nondisplaced, mild  bradycardia, mid 50s, normal S1 S2, regular rate, no murmur, gallop or  pericardial rub. GI SYSTEM obese, soft. Extremities no clubbing, no edema.  large ecchymotic area fading left wrist. Peripheral pulses: normal (2+)  bilaterally. Skin: normal, no rash. Neurologic exam: grossly non-focal.  Pyschiatric appropriate.          Data    ---     _EKG (reviewed personally)_ :    Piers Baade 01/28/2017 1:56:28 PM > sinus bradycardia, 52 BPM, PRWP, nonspecific  ST changes. 09/12/16: sinus rhythm, 55 .    _Echo_ :    10/2016: Borderline asymmetric LV hypertrophy and normal LV systolic function  of 65%. Moderate to severe LAE. Mild pulmonary hypertension with PA pressure  of 43 mmHg and elevated estimated RAP. Fat pad anteriorly. Borderline  ascending aorta dilatation of 3.9 cm.    _Lipids_ :    4/16: TC 214 TRI 103 HDL 60 LDL 133. 3/15: TC 214, TG 68, HDL 58, LDL 142.    _Lexiscan Myoview_ :    12/2016: Negative ETT for chest pain or diagnostic EKG changes. Mild to  moderate LAD ischemia on imaging with mildly elevated TID of 1.18. Breast  attenuation which may reduce specificity of this finding. Normal LV size and  wall motion.Emogene Morgan catheterization_ :    01/08/17: Normal coronary arteries with diastolic heart failure and elevated  left heart filling pressures. Normal LV systolic function.          Impression/Recommendations    ---       **1\. Abnormal stress test with nuclear imaging**    Clinical Notes: False positive. Normal coronaries on cardiac cath.    ---        **2\. Chronic diastolic (congestive) heart failure**    Clinical Notes: Diastolic dysfunction and elevated left heart filling  pressures on cath.    Doing well with stable kidney functioning  s/p diuretic change from HCTZ to  lasix 40 BID.    Continue the same along with NAS diet, weight monitoring.        **3\. Paroxysmal atrial fibrillation**    _IMAGING: **EKG_    Clinical Notes: s/p PVI 4/20. Apparently unsuccessful and unable to  cardiovert; records requested as history taken by patient recall today. Lately  spontaneously converted w/ increased flecainide to 100 bid and metoprolol to  50 bid. Metoprolol reduced on own to 25 bid due to slow HR/symptoms and  remains in SR today.  Has f/u with Dr. Malcolm Metro next week. Doesn't want any further procedures and  says she wished she never went for ablation. Her afib has actually been quite  infrequent based on her report and for now would like to remain on flecainide  although discussed it's limitations and potential use of other antiarrhythmics  such as sotalol in the future. Will come back in a few months to revisit. For  now she is very motivated to lose weight and focusing on trigger reduction.  Continues on Xarelto.        **4\. Essential (primary) hypertension**    Notes: Patient Educated with: [email or web address].pdf (AHANutritionSheet.pdf).    Clinical Notes: Normotensive and stable on current regimen.        **5\. Hypercholesterolemia**    Notes: Patient Educated with: [email or web address].pdf (AHANutritionSheet.pdf)  Patient Educated with: [email or web address].pdf (AHANutritionSheet.pdf).    Clinical Notes:    Normotensive. Continue the same.        **6\. Pericarditis**    Clinical Notes: Apparently w/ pericarditis post-PVI.    No recurrent symptoms and EKG today benign.    On colchicine for 1 more week.    Will obtain records including echo done during [facility] admit to determine whether  f/u echo is needed. She doesn't believe she had any significant effusion.      Preventive Medicine    ---      Counseling: Lifestyle therapeutic issues . Diet . Exercise .    ---      Procedure Codes    ---      [address] IH    ---      Follow Up    ---    3-4 months MD     Electronically signed by Homero Fellers MD on 02/13/2017 at 06:44 PM EDT    Sign off status: Completed        * * *        [facility]    [address]    [place], [place] [address]    Tel: [phone]    Fax: [phone]              * * *          Patient: AQUANETTA, SCHWARZ DOB: Mar 28, 1956 Progress Note: Lalanya Rufener, NP  01/28/2017    ---    Note generated by eClinicalWorks EMR/PM Software ([email or web address])

## 2017-01-28 NOTE — Progress Notes (Signed)
* * *        **  [name]    --- ---    66 Y old Female, DOB: Nov 25, 1955    [name] [name], [place], [place] [address]    Home: [phone]    Provider: [name], MD        * * *    Telephone Encounter    ---    Answered by   Maximino Greenland  Date: 01/28/2017         Time: 01:42 PM    Reason   [facility] admisssion    --- ---            Message                      she was admitted to [facility] 4/20 for afib ablation, pericarditis, can we obtain her records inc echo, procedure note, discharge summary? thx                Action Taken   Dominique,Denise 01/30/2017 12:36:22 PM > faxed                * * *                ---          * * *          Patient: Emma [name], Emma [name] DOB: November 10, 1955 Provider: [name],  MD 01/28/2017    ---    Note generated by eClinicalWorks EMR/PM Software ([email or web address])

## 2017-02-01 ENCOUNTER — Ambulatory Visit: Admitting: Medical

## 2017-02-01 NOTE — Progress Notes (Signed)
* * *        Emma [name]**    ------    53 Y old Female, DOB: 1956-03-21    Account Number: [id number]    [address], [place], [id number]    Home: [phone]    Guarantor: Emma [name] Insurance: Bc/bs [employer] Blue Payer ID: [id number]    PCP: Mardene Sayer    Appointment Facility: [facility] Office        * * *    02/01/2017    Progress Note: Consuelo Pandy, PA    ------    ---        Current Medications    ---    Taking      * flecainide 100 mg tablet 1 tab(s) orally BID     ---    * Tylenol Extra Strength 500 mg tablet 2 tab(s) orally every 6 hours prn     ---    * Xarelto 20 mg tablet 1 tab(s) orally once a day (in the evening)     ---    * Metoprolol Tartrate 25 mg tablet 1 tab(s) orally Twice a day     ---    * Lasix 40 mg tablet 1 tab(s) orally once a day     ---    * loperamide-simethicone 2 mg-125 mg tablet 1 tab(s) orally Daily as needed for diarrhea     ---    * loratadine 10 mg tablet 1 tab(s) orally once a day     ---    * colchicine 0.6 mg tablet 1 tab(s) orally once a day     ---    Discontinued    * losartan 50 mg tablet 1 tab(s) orally BID, Notes: Held by Keane Scrape MD until PCP visit     ---    * Medication List reviewed and reconciled with the patient     ---      Past Medical History    ---      Paroxysmal Atrial Firbrilation Dr Audley Hose .        ---    HTN .        ---    Obesity .        ---    O/A .        ---    hx of depression she was on Zoloft.        ---    Spinal Stenosis .        ---    Colonoscopy at age 56 normal . repeat at age 89.        ---    A. Fib. admit 08/2016.        ---    12/2016: A fib admit to [facility] for Ablation, complications, post ablatipon  Pericarditis. - Unsuccessful.        ---    12/2016: Cardiac Cath False POS stress test, normal Coronary arteries,  Diastolic HF w/ elevated heart filling pressures. normal LV systolic function.  Marland Kitchen        ---      Surgical History    ---      C section    ---    Rhinoplasty 1875    ---     Tota left knee replacement 05/2011    ---      Family History    ---      Father: deceased 74 yrs, MI x2    ---  Mother: deceased 45 yrs, Hodgkins Lymphoma, Sarcoma, s/p cholecystectomy,  shingles    ---    Daughter 1: alive 24 yrs, [place]    ---    Daughter 2: alive 33 yrs, [place]    ---    Brother 1: alive, Chronic Schizophreni c    ---    Brother 2: alive, HTN    ---    Sister 1: alive 72 yrs, HTN , CAD    ---    Sister 2: alive 17 yrs, SLE , HTN    ---    2 brother(s) , 2 sister(s) - healthy. 2daughter(s) - healthy.    ---      **Social History**    ---    Tobacco Use  Are you a? Former smoker quit at the age of 62  , Patient was  counseled on the dangers of tobacco use: 02/01/2017  .    Alcohol 1 per week.    Drug use: yes, Past use: marrijauana.Marland KitchenMarland KitchenPCP a couple times as a teen .    Marital Status: Married.    Children: two children.    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    Exercise: none....except physical therapy.    Sexually active: yes .    MOLST Form  Does patient have completed form? No  , MOLST Form discussed on:  11/19/2013  .    HCP  Does patient have a current proxy? No  , HCP was discussed: 11/19/2013  .    COPD Population Screener Total Score: 0.      Allergies    ---      Ace inhibitors: cough: Allergy    ---      Hospitalization/Major Diagnostic Procedure    ---      [facility]iac ablation for A fib, Unsuccessful. 12/2016    ---        Reason for Appointment    ---      1\. Hospital f/u    ---      History of Present Illness    ---    _Interim History_ :    61 yr old female, PMHx and MEDS per below, here for hospital f/u.    She was Admit to [facility] 4/20-4/23/18 for cardiac ablation for atrial fib.  complicated by a fib w/ rapid ventricular response. Cardioverted 7 times w/  out relief, finally converted w/ Cardizem drip, Flecainide increased from 50  mg BID to 100 mg BID, Metoprolol Succ increased from 25 mg BID to 50 mg BID.    Subsequently developed post ablation  pericarditis. treated w/ Colchicine w/  resolution of sxs. Still taking Colchicine.    Maintained on Xarelto.    Losartan was held due to hypotension w/ above MEDS.    CPAP cont for sleep apnea.    She saw Cardio NP last week Metoprolol decreased again to 25 mg BID due to  bradycardia.    She is feeling better.    Very motivated to make lifestyle change, exercise and lose weight.    She WILL NOT undergo repeat attempt of ablation for a fib.    She does c/o bil LE edema, Left > right. still w/ mild shortness of breath,  but significantly improved.    She has upcoming appt w/ Dr Burman Blacksmith, interventional cardiologist at [facility]  soon, and Dr Audley Hose in 06/2017.    In 12/2016: Abnl ETT- Myoview.    Cardiac cath 01/08/17: confimred false POS stres test. normal  Coronoary  arerties. + chronic diastolic heart failure w/ normal LV systolic function.      Vital Signs    ---    Wt 327, Ht 65, BP 124/72, BMI  54.41  , HR 49, 52, **52 repeat** , RR 12, O2  Sat 96%RA.      Examination    ---    Denton Meek Examination_ :    General Appearance:  NAD  ,  alert and oriented  .    Oral cavity:  clear  ,  moist mucus membranes  .    HEENT:  Head - NC/AT  ,  PERRLA  ,  EOMI  ,  TM's clear and flat bilaterally  ,  Oropharynx clear with MMM  .    Neck, Thyroid :  supple  ,  no thyromegaly  ,  no lymphadenopathy, no JVD  .    Heart:  RRR  ,  no murmurs  .    Lungs:  clear to auscultation bilaterally  ,  no wheezes/rhonchi/rales  .    Abdomen:  soft, NT/ND, BS present  .    Extremities:  tr edema bilat LE.  L > R.    Neurologic Exam:  non-focal exam  .          Assessments    ---    1\. Paroxysmal atrial fibrillation - I48.0 (Primary)    ---    2\. Morbid obesity due to excess calories - E66.01    ---    3\. Obstructive sleep apnea syndrome - G47.33    ---    4\. Essential hypertension - I10    ---    5\. Chronic diastolic heart failure - I50.32    ---      Treatment    ---      **1\. Paroxysmal atrial fibrillation**    Notes: Failed ablation,  see above. cont. meds per cardiology. today rate  controlled. cont. NOAC.    ---        **2\. Morbid obesity due to excess calories**    Notes: Motivated to make lifestyle change. working to lose weight , increase  exercise, low sodium and healthier food choices.        **3\. Obstructive sleep apnea syndrome**    Notes: cont CPAP. weight loss efforts.        **4\. Essential hypertension**    Notes: stable, at goal. HR stable in low 50's on reduced does of Metoprolol,  Flecainide.        **5\. Chronic diastolic heart failure**    Notes: stable, cont Lasix, recent lytes normal. low sodium diet, weight loss.        **6\. Others**    Continue flecainide tablet, 100 mg, 1 tab(s), orally, BID    Continue Tylenol Extra Strength tablet, 500 mg, 2 tab(s), orally, every 6  hours prn    Continue Xarelto tablet, 20 mg, 1 tab(s), orally, once a day (in the evening)    Continue Metoprolol Tartrate tablet, 25 mg, 1 tab(s), orally, Twice a day    Continue Lasix tablet, 40 mg, 1 tab(s), orally, once a day    Continue loperamide-simethicone tablet, 2 mg-125 mg, 1 tab(s), orally, Daily  as needed for diarrhea    Continue loratadine tablet, 10 mg, 1 tab(s), orally, once a day    Continue colchicine tablet, 0.6 mg, 1 tab(s), orally, once a day    Notes: Heart failure - fluids and diuretics material was published to portal.  Clinical Notes: hospital notes , radiology, labs and procedures rev'd.      Follow Up    ---    CPE, sooner prn.    Electronically signed by Mardene Sayer , M.D. on 02/10/2017 at 11:46 AM EDT    Sign off status: Completed        * * *        [facility]    [address]    [place], [place] [address]    Tel: [phone]    Fax: [phone]              * * *          Patient: Emma [name], Emma [name] DOB: 1956-01-03 Progress Note: Consuelo Pandy, PA  02/01/2017    ---    Note generated by eClinicalWorks EMR/PM Software ([email or web address])

## 2017-02-04 ENCOUNTER — Ambulatory Visit: Admitting: Cardiovascular Disease

## 2017-02-04 NOTE — Progress Notes (Signed)
.  Progress Notes  .  Patient: Emma [name]  Provider: Guerry Minors  DOB:1956-05-31 Age: 61 Y Sex: Female  .  PCP: Mardene Sayer  MD  Date: 02/04/2017  .  --------------------------------------------------------------------------------  .  REASON FOR APPOINTMENT  .  1. Atrial fibrillation  .  HISTORY OF PRESENT ILLNESS  .  Cardiology:  I had the pleasure of seeing Ms.  Emma [name] on follow up at [facility] on Monday Feb 04, 2017 2 weeks after her AF  ablation in the company of her husband. Ms. 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 After failing flecainide, she was referred for ablation  of her paroxysmal atrial fibrillation. The risk factors she has  are obesity, sleep apnea, and hypertension. On 01/18/2017, she  underwent pulmonary vein isolation using cryoablation. There was  some difficulty in isolating the left superior pulmonary vein.  Even after it was isolated pacing or mechanical trauma to the  left atrium led to the precipitation of atrial fibrillation. In  light of this, she was advised to resume the flecainide that had  been held prior to the ablation, albeit at higher dose. She had  been on 50 mg twice a day, she was hospitalized for 2 days during  which her dose of flecainide was raised to 100 mg twice a day.  She tolerated this well and in fact converted spontaneously back  into sinus rhythm after organizing into an atrial  tachycardia.Mrs. Demond has returned to work and has not had any  recurrence of her PAF. She has OSA and is compliant with CPAP.  She notes that over the past 3 years since starting flecainide  she has gained about 50 lbs. She has ben strongly urged and  encouraged to work on exercising and losing weight as part of a  comprehensive approac in the management of her AF.  Marland Kitchen  Associated Providers:  Primary Care  Provider  Dr. Mardene Sayer.  Referring Providers  Dr. Homero Fellers.  Marland Kitchen  CURRENT MEDICATIONS  .  Taking Colchicine 0.4 mg Once a day  Taking Flecainide Acetate 100MG  Tablet Orally twice daily  Taking Furosemide 40 MG Once a day  Taking Loratadine 10 MG Tablet 1 tablet Orally Once a day  Taking Metoprolol Succinate ER 25 mg Tablet Extended Release 24  Hour 1 tablet Orally bid  Taking Metoprolol Tartrate 25 mg Tablet 1 tablet with food Orally  prn  Taking Prevagen Extra Strength 20 MG Capsule Orally  Taking Xarelto 20 MG Tablet 1 tablet with food Orally Once a day  Medication List reviewed and reconciled with the patient  .  PAST MEDICAL HISTORY  .  Paroxysmal Atrial fibrillation  Hypertension  Osteoarthritis  Obesity  Spinal Stenosis  OSA - on CPAP which she uses consistently  .  ALLERGIES  .  ace inhibitors  .  FAMILY HISTORY  .  Father Deceased 72 years: CAD, MI, AAAMother: Deceased 67 years,  died of CA: lymphoma, sarcoma with METSSister: CAD, HTN.  .  SOCIAL HISTORY  .  Quit Smoking in 1980sNo Illicit drugs.  Marland Kitchen  REVIEW OF SYSTEMS  .  Cardiology - [facility]:  .  Constitutional    + weight gain or loss in the past year, no  night sweats or fevers, no loss of appetite, no feelings of  fatigue . Cardiovascular    no chest discomfort or pain, + edema  of the feet or ankles, + shortness of breath, no palpitations, no  syncope, no presyncope . Ear/Nose/Mouth/Throat    no hoarseness  or change in voice, no hearing difficulty, + ringing in ears, no  nasal congestion, no post-nasal drip, no nose bleeds, no loss of  smell . Endocrine    + awakening at night to urinate, does not  drink lots of water, no feeling of cold or hot intolerance .  Neurology/Psychiatry    no dizziness or lightheadedness, no  unsteady gait, no numbness or tingling sensation, no headaches,  no forgetfulness, no anxious or nervous feelings, no insomnia, no  feeling of being too sleepy all the time . Respiratory    no  cough, no sputum or phlegm  production, no blood in sputum, no  difficulty breathing with activity or rest, no wheezing, no loud  snoring .  Marland Kitchen  VITAL SIGNS  .  Pain scale 0, Ht-in 65, Wt-lbs 330, BMI 54.909, BP 118/60, HR 50,  SaO2 98 %.  Marland Kitchen  EXAMINATION  .  Cardiac Testing:  EKG:( 02/04/2017 ) , Sinus bradycardia 50 bpm.  Echo:  .  01/21/2017  Left Ventricle The left ventricle is normal in size. Mildly  increased left ventricular wall thickness. Left ventricular  systolic function is normal. Ejection Fraction = 55%. No regional  wall motion abnormalities noted.  Right Ventricle The right ventricle is mildly dilated. The right  ventricular systolic function is low normal.  Atria The left atrium is moderately dilated. The right atrium is  mild to moderately dilated. The inferior vena cava is normal size  with > 50% respirophasic variation, consistent with normal right  atrial pressure.  Mitral Valve The mitral valve is normal. There is mild mitral  annular calcification. There is trace mitral regurgitation.  Tricuspid Valve The tricuspid valve is not well visualized, but  is grossly normal. There is trace tricuspid regurgitation.  Aortic Valve The aortic valve is mildly thickened. The aortic  valve is trileaflet. The aortic valve opens well. Trace aortic  regurgitation.  Pulmonic Valve The pulmonic valve is not well seen, but is  grossly normal. Trace pulmonic valvular regurgitation.  Great Vessels The aortic root is normal size.  Pericardium/Pleural There is a possible trivial pericardial  effusion near the right atrium. There are no echocardiographic  indications of cardiac tamponade.  Interpretation Summary I have personally reviewed the study and  confirm the fellow's findings. Please see full report for  findings. Study requested by clinical service to assess for  pericardial effusion in a patient who developed pleuritic chest  pain post Afib ablation. There is normal biventricular function.  There is a possible trivial pericardial effusion  near the right  atrium. There are no echocardiographic indications of cardiac  tamponade. No prior transthoracic study is available for  comparison. There is no significant change compared to prior TEE  of 01/17/2017.  Marland Kitchen  PHYSICAL EXAMINATION  .  Exam:  General  no apparent distress. HEENT  PERRLA, EOMI, no JVD,  carotid pulses 2+ bilaterally, no bruits. Cardiovascular  regular  rate and rhythm, clear S1 and S2, no murmurs, no gallops, no  rubs, no heaves, no thrills. Pulmonary  clear to auscultation and  percussion, no wheezing, no rhonchi, no rales. Abdomen  soft, non  tender, non distended, no organomegaly. Extremities  warm, well  perfused, no edema, no clubbing, no cyanosis. Neurologic  non  focal.  .  ASSESSMENTS  .  Atrial fibrillation - I48.91  .  TREATMENT  .  Atrial fibrillation  Continue Flecainide Acetate Tablet, 100MG , Orally, twice daily  Continue Metoprolol Succinate ER Tablet Extended Release 24 Hour,  25 mg, 1 tablet, Orally, bid  Continue Metoprolol Tartrate Tablet, 25 mg, 1 tablet with food,  Orally, prn  Continue Xarelto Tablet, 20 MG, 1 tablet with food, Orally, Once  a day  .  FOLLOW UP  .  3 Months  .  Electronically signed by Guerry Minors , MD on  02/11/2017 at 08:24 AM EDT  .  Document electronically signed by Guerry Minors    .

## 2017-02-04 NOTE — Progress Notes (Signed)
.  Progress Notes  .  Patient: Emma [name]  Provider: Guerry Minors  DOB:04/07/1956 Age: 61 Y Sex: Female  .  PCP: Mardene Sayer  MD  Date: 02/04/2017  .  --------------------------------------------------------------------------------  .  REASON FOR APPOINTMENT  .  1. Atrial fibrillation  .  HISTORY OF PRESENT ILLNESS  .  Cardiology:  I had the pleasure of seeing Ms.  Emma [name] on follow up at [facility] on Monday Feb 04, 2017 2 weeks after her AF  ablation in the company of her husband. Ms. 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 After failing flecainide, she was referred for ablation  of her paroxysmal atrial fibrillation. The risk factors she has  are obesity, sleep apnea, and hypertension. On 01/18/2017, she  underwent pulmonary vein isolation using cryoablation. There was  some difficulty in isolating the left superior pulmonary vein.  Even after it was isolated pacing or mechanical trauma to the  left atrium led to the precipitation of atrial fibrillation. In  light of this, she was advised to resume the flecainide that had  been held prior to the ablation, albeit at higher dose. She had  been on 50 mg twice a day, she was hospitalized for 2 days during  which her dose of flecainide was raised to 100 mg twice a day.  She tolerated this well and in fact converted spontaneously back  into sinus rhythm after organizing into an atrial  tachycardia.Mrs. Gillingham has returned to work and has not had any  recurrence of her PAF. She has OSA and is compliant with CPAP.  She notes that over the past 3 years since starting flecainide  she has gained about 50 lbs. She has ben strongly urged and  encouraged to work on exercising and losing weight as part of a  comprehensive approac in the management of her AF.  Marland Kitchen  Associated Providers:  Primary Care  Provider  Dr. Mardene Sayer.  Referring Providers  Dr. Homero Fellers.  Marland Kitchen  CURRENT MEDICATIONS  .  Taking Colchicine 0.4 mg Once a day  Taking Flecainide Acetate 100MG  Tablet Orally twice daily  Taking Furosemide 40 MG Once a day  Taking Loratadine 10 MG Tablet 1 tablet Orally Once a day  Taking Metoprolol Succinate ER 25 mg Tablet Extended Release 24  Hour 1 tablet Orally bid  Taking Metoprolol Tartrate 25 mg Tablet 1 tablet with food Orally  prn  Taking Prevagen Extra Strength 20 MG Capsule Orally  Taking Xarelto 20 MG Tablet 1 tablet with food Orally Once a day  Medication List reviewed and reconciled with the patient  .  PAST MEDICAL HISTORY  .  Paroxysmal Atrial fibrillation  Hypertension  Osteoarthritis  Obesity  Spinal Stenosis  OSA - on CPAP which she uses consistently  .  ALLERGIES  .  ace inhibitors  .  FAMILY HISTORY  .  Father Deceased 81 years: CAD, MI, AAAMother: Deceased 40 years,  died of CA: lymphoma, sarcoma with METSSister: CAD, HTN.  .  SOCIAL HISTORY  .  Quit Smoking in 1980sNo Illicit drugs.  Marland Kitchen  REVIEW OF SYSTEMS  .  Cardiology - [facility]:  .  Constitutional    + weight gain or loss in the past year, no  night sweats or fevers, no loss of appetite, no feelings of  fatigue . Cardiovascular    no chest discomfort or pain, + edema  of the feet or ankles, + shortness of breath, no palpitations, no  syncope, no presyncope . Ear/Nose/Mouth/Throat    no hoarseness  or change in voice, no hearing difficulty, + ringing in ears, no  nasal congestion, no post-nasal drip, no nose bleeds, no loss of  smell . Endocrine    + awakening at night to urinate, does not  drink lots of water, no feeling of cold or hot intolerance .  Neurology/Psychiatry    no dizziness or lightheadedness, no  unsteady gait, no numbness or tingling sensation, no headaches,  no forgetfulness, no anxious or nervous feelings, no insomnia, no  feeling of being too sleepy all the time . Respiratory    no  cough, no sputum or phlegm  production, no blood in sputum, no  difficulty breathing with activity or rest, no wheezing, no loud  snoring .  Marland Kitchen  VITAL SIGNS  .  Pain scale 0, Ht-in 65, Wt-lbs 330, BMI 54.909, BP 118/60, HR 50,  SaO2 98 %.  Marland Kitchen  EXAMINATION  .  Cardiac Testing:  EKG:( 02/04/2017 ) , Sinus bradycardia 50 bpm.  Echo:  .  01/21/2017  Left Ventricle The left ventricle is normal in size. Mildly  increased left ventricular wall thickness. Left ventricular  systolic function is normal. Ejection Fraction = 55%. No regional  wall motion abnormalities noted.  Right Ventricle The right ventricle is mildly dilated. The right  ventricular systolic function is low normal.  Atria The left atrium is moderately dilated. The right atrium is  mild to moderately dilated. The inferior vena cava is normal size  with > 50% respirophasic variation, consistent with normal right  atrial pressure.  Mitral Valve The mitral valve is normal. There is mild mitral  annular calcification. There is trace mitral regurgitation.  Tricuspid Valve The tricuspid valve is not well visualized, but  is grossly normal. There is trace tricuspid regurgitation.  Aortic Valve The aortic valve is mildly thickened. The aortic  valve is trileaflet. The aortic valve opens well. Trace aortic  regurgitation.  Pulmonic Valve The pulmonic valve is not well seen, but is  grossly normal. Trace pulmonic valvular regurgitation.  Great Vessels The aortic root is normal size.  Pericardium/Pleural There is a possible trivial pericardial  effusion near the right atrium. There are no echocardiographic  indications of cardiac tamponade.  Interpretation Summary I have personally reviewed the study and  confirm the fellow's findings. Please see full report for  findings. Study requested by clinical service to assess for  pericardial effusion in a patient who developed pleuritic chest  pain post Afib ablation. There is normal biventricular function.  There is a possible trivial pericardial effusion  near the right  atrium. There are no echocardiographic indications of cardiac  tamponade. No prior transthoracic study is available for  comparison. There is no significant change compared to prior TEE  of 01/17/2017.  Marland Kitchen  PHYSICAL EXAMINATION  .  Exam:  General  no apparent distress. HEENT  PERRLA, EOMI, no JVD,  carotid pulses 2+ bilaterally, no bruits. Cardiovascular  regular  rate and rhythm, clear S1 and S2, no murmurs, no gallops, no  rubs, no heaves, no thrills. Pulmonary  clear to auscultation and  percussion, no wheezing, no rhonchi, no rales. Abdomen  soft, non  tender, non distended, no organomegaly. Extremities  warm, well  perfused, no edema, no clubbing, no cyanosis. Neurologic  non  focal.  .  ASSESSMENTS  .  Atrial fibrillation - I48.91  .  TREATMENT  .  Atrial fibrillation  Continue Flecainide Acetate Tablet, 100MG , Orally, twice daily  Continue Metoprolol Succinate ER Tablet Extended Release 24 Hour,  25 mg, 1 tablet, Orally, bid  Continue Metoprolol Tartrate Tablet, 25 mg, 1 tablet with food,  Orally, prn  Continue Xarelto Tablet, 20 MG, 1 tablet with food, Orally, Once  a day  .  FOLLOW UP  .  3 Months  .  Electronically signed by Guerry Minors , MD on  02/10/2017 at 09:59 PM EDT  .  Document electronically signed by Guerry Minors    .

## 2017-02-04 NOTE — Progress Notes (Signed)
* * *        **Emma [name]**    --- ---    61 Y old Female, DOB: 05-Apr-1956, External MRN: [id number]    Account Number: [id number]    [address], [address], [place], [id number]    Home: [phone]    Insurance: HMO BLUE OUT IPA Payer ID: PAPER    PCP: Mardene Sayer, MD Referring: [facility] External Visit ID:  [id number]    Appointment Facility: [facility]        * * *    02/04/2017  Progress Notes: Guerry Minors, MD **CHN#:** [phone]    --- ---    ---        Current Medications    ---    Taking     * Colchicine 0.4 mg Once a day    ---    * Flecainide Acetate 100MG  Tablet Orally twice daily    ---    * Furosemide 40 MG Once a day    ---    * Loratadine 10 MG Tablet 1 tablet Orally Once a day    ---    * Metoprolol Succinate ER 25 mg Tablet Extended Release 24 Hour 1 tablet Orally bid    ---    * Metoprolol Tartrate 25 mg Tablet 1 tablet with food Orally prn    ---    * Prevagen Extra Strength 20 MG Capsule Orally     ---    * Xarelto 20 MG Tablet 1 tablet with food Orally Once a day    ---    * Medication List reviewed and reconciled with the patient    ---      Past Medical History    ---       Paroxysmal Atrial fibrillation.        ---    Hypertension.        ---    Osteoarthritis.        ---    Obesity.        ---    Spinal Stenosis.        ---    OSA - on CPAP which she uses consistently.        ---      Family History    ---      Father Deceased 32 years: CAD, MI, AAA    Mother: Deceased 7 years, died of CA: lymphoma, sarcoma with METS    Sister: CAD, HTN.    ---      Social History    ---      Quit Smoking in 1980s    No Illicit drugs.    ---      Allergies    ---      ace inhibitors    ---    [Allergies Verified]      Review of Systems    ---     _Cardiology - [facility]_ :    Constitutional \+ weight gain or loss in the past year, no night sweats or  fevers, no loss of appetite, no feelings of fatigue. Cardiovascular no chest  discomfort or pain, \+ edema of the feet or  ankles, \+ shortness of breath, no  palpitations, no syncope, no presyncope. Ear/Nose/Mouth/Throat no hoarseness  or change in voice, no hearing difficulty, + ringing in ears, no nasal  congestion, no post-nasal drip, no nose bleeds, no loss of smell. Endocrine \+  awakening at night to urinate, does  not drink lots of water, no feeling of  cold or hot intolerance. Neurology/Psychiatry no dizziness or lightheadedness,  no unsteady gait, no numbness or tingling sensation, no headaches, no  forgetfulness, no anxious or nervous feelings, no insomnia, no feeling of  being too sleepy all the time. Respiratory no cough, no sputum or phlegm  production, no blood in sputum, no difficulty breathing with activity or rest,  no wheezing, no loud snoring.            Reason for Appointment    ---      1\. Atrial fibrillation    ---      History of Present Illness    ---     _Cardiology_ :    I had the pleasure of seeing [name] on follow up at [facility] on Monday Feb 04, 2017 2 weeks after her AF  ablation in the company of her husband.    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 After failing flecainide, she was referred for ablation of her  paroxysmal atrial fibrillation. The risk factors she has are obesity, sleep  apnea, and hypertension. On 01/18/2017, she underwent pulmonary vein isolation  using cryoablation. There was some difficulty in isolating the left superior  pulmonary vein. Even after it was isolated pacing or mechanical trauma to the  left atrium led to the precipitation of atrial fibrillation. In light of this,  she was advised to resume the flecainide that had been held prior to the  ablation, albeit at higher dose. She had been on 50 mg twice a day, she was  hospitalized for 2 days during which her dose of flecainide was raised to 100  mg twice a day.  She tolerated this well and in fact converted spontaneously  back into sinus rhythm after organizing into an atrial tachycardia.    Emma [name] has returned to work and has not had any recurrence of her PAF.  She has OSA and is compliant with CPAP. She notes that over the past 3 years  since starting flecainide she has gained about 50 lbs. She has ben strongly  urged and encouraged to work on exercising and losing weight as part of a  comprehensive approac in the management of her AF.     _Associated Providers_ :    Primary Care Provider Dr. Mardene Sayer. Referring Providers Dr. Homero Fellers.      Vital Signs    ---    Pain scale 0, Ht-in 65, Wt-lbs 330, BMI **54.909** , BP 118/60, HR 50, SaO2 98  %.      Examination    ---     _Cardiac Testing_ :    EKG: ( 02/04/2017 ) , Sinus bradycardia 50 bpm.    Echo: ****    **01/21/2017    Left Ventricle **The left ventricle is normal in size. Mildly increased left  ventricular wall thickness. Left ventricular systolic function is normal.  Ejection Fraction = 55%. No regional wall motion abnormalities noted.    **Right Ventricle** The right ventricle is mildly dilated. The right  ventricular systolic function is low normal.    **Atria** The left atrium is moderately dilated. The right atrium is mild to  moderately dilated. The inferior vena cava is normal size with > 50%  respirophasic variation, consistent with normal right atrial pressure.    **  Mitral Valve** The mitral valve is normal. There is mild mitral annular  calcification. There is trace mitral regurgitation.    **Tricuspid Valve** The tricuspid valve is not well visualized, but is grossly  normal. There is trace tricuspid regurgitation.    **Aortic Valve** The aortic valve is mildly thickened. The aortic valve is  trileaflet. The aortic valve opens well. Trace aortic regurgitation.    **Pulmonic Valve** The pulmonic valve is not well seen, but is grossly normal.  Trace pulmonic valvular regurgitation.     **Great Vessels** The aortic root is normal size.    **Pericardium/Pleural** There is a possible trivial pericardial effusion near  the right atrium. There are no echocardiographic indications of cardiac  tamponade.    **Interpretation Summary** I have personally reviewed the study and confirm  the fellow's findings. Please see full report for findings. Study requested by  clinical service to assess for pericardial effusion in a patient who developed  pleuritic chest pain post Afib ablation. There is normal biventricular  function. There is a possible trivial pericardial effusion near the right  atrium. There are no echocardiographic indications of cardiac tamponade. No  prior transthoracic study is available for comparison. There is no significant  change compared to prior TEE of 01/17/2017.          Physical Examination    ---     _Exam_ :    General no apparent distress. HEENT PERRLA, EOMI, no JVD, carotid pulses 2+  bilaterally, no bruits. Cardiovascular regular rate and rhythm, clear S1 and  S2, no murmurs, no gallops, no rubs, no heaves, no thrills. Pulmonary clear to  auscultation and percussion, no wheezing, no rhonchi, no rales. Abdomen soft,  non tender, non distended, no organomegaly. Extremities warm, well perfused,  no edema, no clubbing, no cyanosis. Neurologic non focal.          Assessments    ---    1\. Atrial fibrillation - I48.91    ---      Treatment    ---       **1\. Atrial fibrillation**    Continue Flecainide Acetate Tablet, 100MG , Orally, twice daily    Continue Metoprolol Succinate ER Tablet Extended Release 24 Hour, 25 mg, 1  tablet, Orally, bid    Continue Metoprolol Tartrate Tablet, 25 mg, 1 tablet with food, Orally, prn    Continue Xarelto Tablet, 20 MG, 1 tablet with food, Orally, Once a day    ---      Follow Up    ---    3 Months    Electronically signed by Guerry Minors , MD on 02/11/2017 at 08:24 AM EDT    Sign off status: Completed        * * *        [facility]    [address]    [place], [place] [address]    Tel: [phone]    Fax: [phone]              * * *          Patient: Emma [name], Emma [name] DOB: 23-Feb-1956 Progress Note: Guerry Minors, MD  02/04/2017    ---    Note generated by eClinicalWorks EMR/PM Software ([email or web address])

## 2017-02-10 ENCOUNTER — Ambulatory Visit: Admitting: Internal Medicine

## 2017-02-10 ENCOUNTER — Ambulatory Visit (HOSPITAL_BASED_OUTPATIENT_CLINIC_OR_DEPARTMENT_OTHER): Admitting: Cardiovascular Disease

## 2017-02-15 ENCOUNTER — Ambulatory Visit

## 2017-02-19 ENCOUNTER — Ambulatory Visit: Admitting: Internal Medicine

## 2017-02-19 ENCOUNTER — Ambulatory Visit

## 2017-02-19 NOTE — Progress Notes (Signed)
* * *        Emma [name]**    ------    61 Y old Female, DOB: 10/08/1955    Account Number: [id number]    [address], [place], [id number]    Home: [phone]    Guarantor: Emma [name] Insurance: Bc/bs [employer] Blue Payer ID: [id number]    Appointment Facility: [facility]        * * *    02/19/2017    Progress Notes: Mardene Sayer, M.D.    ------    ---        Current Medications    ---    Taking      * flecainide 100 mg tablet 1 tab(s) orally BID     ---    * Tylenol Extra Strength 500 mg tablet 2 tab(s) orally every 6 hours prn     ---    * Xarelto 20 mg tablet 1 tab(s) orally once a day (in the evening)     ---    * Metoprolol Tartrate 25 mg tablet 1 tab(s) orally Twice a day     ---    * Lasix 40 mg tablet 1 tab(s) orally once a day     ---    * loperamide-simethicone 2 mg-125 mg tablet 1 tab(s) orally Daily as needed for diarrhea     ---    * loratadine 10 mg tablet 1 tab(s) orally once a day     ---    Discontinued    * colchicine 0.6 mg tablet 1 tab(s) orally once a day     ---    * Medication List reviewed and reconciled with the patient     ---      Past Medical History    ---      Paroxysmal Atrial Firbrilation Dr Audley Hose .        ---    HTN .        ---    Obesity .        ---    O/A .        ---    hx of depression she was on Zoloft.        ---    Spinal Stenosis .        ---    Colonoscopy at age 61 normal . repeat at age 41.        ---    A. Fib. admit 08/2016.        ---    12/2016: A fib admit to [facility] for Ablation, complications, post ablatipon  Pericarditis. - Unsuccessful.        ---    12/2016: Cardiac Cath False POS stress test, normal Coronary arteries,  Diastolic HF w/ elevated heart filling pressures. normal LV systolic function.  Marland Kitchen        ---      Surgical History    ---      C section    ---    Rhinoplasty 1875    ---    Tota left knee replacement 05/2011    ---      Family History    ---      Father: deceased 69 yrs, MI x2, diagnosed  with Heart Disease    ---    Mother: deceased 49 yrs, Hodgkins Lymphoma, Sarcoma, s/p cholecystectomy,  shingles    ---    Daughter 1: alive 24 yrs, [place]    ---  Daughter 2: alive 7 yrs, [place] [employer]    ---    Brother 1: alive, Chronic Schizophreni c, diagnosed with Mental Illness    ---    Brother 2: alive, HTN, diagnosed with Hypertension    ---    Sister 1: alive 72 yrs, HTN , CAD, diagnosed with Hypertension, Heart Disease    ---    Sister 2: alive 76 yrs, SLE , HTN, diagnosed with Hypertension    ---    2 brother(s) , 2 sister(s) - healthy. 2daughter(s) - healthy.    ---      **Social History**    ---    Tobacco Use    Are you a? _Former smoker quit at the age of 28_    Patient was counseled on the dangers of tobacco use: _05/22/2018_    Alcohol 1 per week.    Drug use: yes, Past use: marrijauana.Marland KitchenMarland KitchenPCP a couple times as a teen .    Marital Status: Married.    Children: two children.    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    Exercise: none....except physical therapy.    Sexually active: yes .    Depression Screening I    Little interest or pleasure in doing things _More than half the days_    Feeling down, depressed, or hopeless _More than half the days_    Trouble falling or staying asleep, or sleeping too much _More than half the  days_    Feeling tired or having little energy _Several days_    Poor appetite or overeating _Nearly every day_    Feeling bad about yourself - or that you are a failure or have let yourself or  your family down _Nearly every day_    Trouble concentrating on things, such as reading the newspaper or watching  television _Not at all_    Moving or speaking so slowly that other people could have noticed. Or the  opposite-being so figety or resltess that you have been moving around a lot  more than usual _Not at all_    Thoughts that you would be better off dead, or of hurting yourself _Not at  all_    Total Score _13_    Interpretation _Moderate Depression_    Depression  Screening II    If you checked off any problems, how difficult have these problems made it for  you to do your work, take care of of things at home, or get along with other  people _Not difficult at all_    MOLST Form    Does patient have completed form? _No_    MOLST Form discussed on: _02/19/2015_    HCP    Does patient have a current proxy? _No_    HCP was discussed: _02/19/2015_    COPD Population Screener Total Score: 0.      Gyn History    ---    Last pap smear date 05/2015 Negative, HPV Negative.    Last mammogram date 11/2016 Negative.    Date of Last Period D and C 08/2012 ...linning thickness...Dr. Daphine Deutscher. in  [place].    Menarch Menopause 55, menarch .      OB History    ---    Total pregnancies 2\.    Total living children 2\.      Allergies    ---      Ace inhibitors: cough: Allergy    ---      Hospitalization/Major Diagnostic Procedure    ---  [place] Cardiac ablation for A fib, Unsuccessful. 12/2016    ---      Review of Systems    ---    _CPE FULL ROS_ :    no Visual change. no Hearing loss. no URI symptoms. no Headaches. no Oral  lesions. no Shortness of breath. no Chest pain. no Palpitations. no Back Pain.  no Dysuria. no Urinary frequency or incontinence,  tried oxybutin , negative  . no Diarrhea or constipation. no Rectal bleeding. no Weakness or numbness. no  Lightheadedness or dizziness. no Change in skin lesions. no Edema. no  Difficulty with speech or ambulation. no Arthralgias or myalgias,  Much  betterm  . no Significant weight loss or gain. no Mood changes. no Nausea or  vomiting. no Reflux symptoms. no Abdominal Pain.            Reason for Appointment    ---      1\. Annual visit    ---    2\. Check DI Tab    ---      History of Present Illness    ---    _Interim History_ :    61 year old female, PMH as noted below, presents for f/u annual exam. Meds as  per list. Last labs reviewed. Recent admit to [facility] 4/20-4/23/18 for cardiac  ablation for atrial fib. complicated by a  fib w/ rapid ventricular response.  Cardioverted 7 times w/ out relief, finally converted w/ Cardizem drip,  Flecainide increased from 50 mg BID to 100 mg BID, Metoprolol Succ increased  from 25 mg BID to 50 mg BID.    Subsequently developed post ablation pericarditis. treated w/ Colchicine w/  resolution of sxs. Still taking Colchicine.    Maintained on Xarelto. no fall or bleeding    \- sleep apnea..better...    Shingrix advised.    _PHQ 9 Score_ :        Score  **13** .    Completed on:    Date _05/22/2018_          Vital Signs    ---    Wt 330, Ht 65, BP 126/80, BMI  54.91  , HR 50, O2 Sat 95%RA.      Past Orders    ---    _Lab:Urinalysis (Complete) (Order Date - 12/27/2014) (Collection Date -  01/25/2015)_    Result: nit's pos, 5 wbc + sq epi's. see we 01/25/15    UA Bacteria    Trace      None -    UA Bili    Negative      Negative -    UA Blood    Negative      Negative -    UA Clarity    Cloudy    A    Clear -    UA Color    Yellow      Yellow -    UA Glucose    Negative      Negative -    UA Ketones    Negative      Negative -    UA Leuk Est    1+    A    Negative -    UA Nitrite    Positive    A    Negative -    UA PH    6.0      5.0-8.0 -    UA Protein    Negative      Negative -  UA RBC    <1      0-3 - /HPF    UA Spec Grav    1.011      1.005-1.030 -    UA Squamous Epi    17    H    0-5 - /HPF    UA Urobilinogen    Negative      Negative -    UA WBC    5      0-5 - /HPF    Notes: Mansur,Tarina 01/25/2015 2:42:20 PM >    _Lab:Vitamin B12 Level (Order Date - 12/27/2014) (Collection Date -  01/25/2015)_    Result: 303 low normal see 02/16/2015 TE received sleep study    Vitamin B12 Lvl    303      193-986 - pg/mL    Notes: Mansur,Tarina 01/25/2015 5:19:09 PM > Nikeria Kalman 02/16/2015 2:44:47  PM >    _Lab:Vitamin D 25 Hydroxy Level (Order Date - 12/27/2014) (Collection Date -  01/25/2015)_    Result: 30 Low normal    Vitamin D 25 OH Lvl    30       30-100 - nGm/ml    Notes: Mansur,Tarina 01/25/2015 5:18:52 PM > Rhemi Balbach 02/16/2015 2:44:40  PM >    _Lab:CBC w/ Differential (Order Date - 12/27/2014) (Collection Date -  01/25/2015)_    Result: 41.3/13.7 wbc 6.4 normal    Hct    41.3      36.0-47.0 - %    Hgb    13.7      11.8-15.8 - Gm/dL    MCH    30      16-10 - pGm    MCHC    33.2      32.0-36.0 - Gm/dL    MCV    90      96-04 - fL    MPV    9.4      7.4-11.5 - fL    Platelet    328      150-400 - thous/mm3    RBC    4.59      3.60-5.00 - Mil/mm3    RDW    13.4      11.5-14.5 - %    WBC    6.4      3.7-11.2 - thous/mm3    Notes: Mansur,Tarina 01/25/2015 2:43:32 PM >    _Lab:IH A1c Test (Order Date - 12/27/2014) (Collection Date - 12/27/2014)_    Result: 5.5    Notes: Rae Halsted 12/27/2014 3:11:11 PM >    _Lab:Comprehensive Metabolic Panel (Order Date - 12/27/2014) (Collection Date  - 01/25/2015)_    Result: stable. creat 0.516    Albumin Lvl    3.6      3.5-5.0 - Gm/dL    Alk Phos    540      45-117 - Units/L    ALT    27      6-78 - Units/L    AST    19      6-40 - Units/L    Bili Total    0.5      0.2-1.0 - mg/dL    BUN    12      9-81 - mg/dL    Calcium Lvl    9.2      8.5-10.1 - mg/dL    Chloride    191      98-107 - mmol/L    CO2    28  21-32 - mmol/L    Creatinine    0.516    L    0.550-1.300 - mg/dL    Glucose Lvl    77      65-110 - mg/dL    Potassium Lvl    3.9      3.6-5.2 - mmol/L    Sodium Lvl    141      136-145 - mmol/L    Total Protein    7.4      6.0-8.0 - Gm/dL    Anion Gap    8      3-11 -    Notes: Mansur,Tarina 01/25/2015 5:18:28 PM >    _Lab:Folate Level (Order Date - 12/27/2014) (Collection Date - 01/25/2015)_    Result: 9.7 normal    Folate Lvl    9.7      3.1-17.5 - nGm/ml    Notes: Mansur,Tarina 01/25/2015 5:18:12 PM >    _Lab:Lipid Panel (Order Date - 12/27/2014) (Collection Date - 01/25/2015)_    Result: TC 214 HDL 60 LDL 133 trig 103 see  02/16/2015 TE received sleep study    Cardiac Risk    2.22      \-    Chol    214    H    50-200 - mg/dL    Chol VLDL    21      6-67 - mg/dL    HDL    60    H    45-40 - mg/dL    LDL    981    H    1-914 - mg/dL    Trig    782      9-562 - mg/dL    Notes: Mansur,Tarina 01/25/2015 5:18:42 PM > Sukhman Martine 02/16/2015 2:44:03  PM >      Examination    ---    Denton Meek Examination_ :    General Appearance:  Well appearing, in NAD  .    Skin:  No abnormal lesions  .    HEENT:  Head is atraumatic and normocephalic. PERRL. Sclerae are anicteric.  Conjunctivae are not injected. Funduscopic exam reveals clear discs  bilaterally, without exudate or hemorrhage. Mucous membranes are moist,  without oral lesions. Nasal passages are clear. TMs are clear bilaterally  .    Neck, Thyroid :  Supple. Carotid upstroke is intact, without bruit, cervical  adenopathy, or thyromegaly noted  ,  no JVD  .    Breasts :  Normal bilaterally, without palpable mass or nipple discharge.  There is no axillary adenopathy  .    Heart:  Regular rate and rhythm. There is no murmur or click appreciated  .    Lungs:  clear to auscultation bilaterally  .    Abdomen:  Positive bowel sounds. Soft and nontender, without mass,  organomegaly, or bruit  .    Extremities:  No cyanosis, clubbing, or edema  .    Peripheral pulses:  Exam reveals all pulses to be intact, without bruit  .    Neurologic Exam:  Patient is alert and oriented x3. Cranial nerves II through  XII are intact. Sensory exam is intact to light touch. Motor exam is 5/5 in  all muscle groups tested. Reflexes are 2/2 diffusely. Toes are downgoing  bilaterally  .          Assessments    ---    1\. Paroxysmal atrial fibrillation - I48.0 (Primary)    ---    2\.  Screen for colon cancer - Z12.11 (Primary)    ---    3\. Encounter for screening mammogram for breast cancer - Z12.31 (Primary)    ---    4\. Morbid obesity due to excess calories - E66.01    ---    5\. Obstructive sleep  apnea syndrome - G47.33    ---    6\. Essential hypertension - I10    ---    7\. Routine medical exam - Z00.00    ---    8\. Chronic diastolic heart failure - I50.32    ---    9\. Synovitis of right hand - M65.9    ---      Treatment    ---      **1\. Paroxysmal atrial fibrillation**    Clinical Notes: rate controlled failed cardioverstion.    ---        **2\. Screen for colon cancer**    _LAB: stool cards_     This lab was reviewed by Mardene Sayer on  12/24/2018 at 21:52 PM EDT    ------        Clinical Notes: discussed colon cancer screening ...he opted for stool cards  .Marland KitchenMarland KitchenUnderstands risks of colon cancer and that Colonoscopy testing despite few  risks involved still is the gold standard screening method.        **3\. Encounter for screening mammogram for breast cancer**    _IMAGING:  [facility] Breast Screening e-sch*_     This DI was reviewed  by Mardene Sayer on 12/24/2018 at 21:50 PM EDT    ------        Clinical Notes: yearly mammogram recommended.        **4\. Morbid obesity due to excess calories**    _LAB: Hemoglobin A1c_    Clinical Notes: Spent approx 20 minutes with patient discussing healthy eating  habits and meal choices that were more appropriate. Pt to avoid processed  foods and fast foods. Referred to Nutritionist. Advised at lreast 150 minutes  of exercise a week. Patient is ready to make healthier food choices Discussed  patient's BMI (>40=morbid obesity), studies showed harder to lose weight on  your own, discussed possibility of gastric bypass/lapband surgery often used  to help morbidly obese patient's lose weight. I will follow up via portal.        **5\. Obstructive sleep apnea syndrome**    _LAB: CBC w/ Differential_    Clinical Notes: Conitnue regular use of CPAP.Marland Kitchenpatient has noted a therapeutic  benefit.        **6\. Essential hypertension**    _LAB: Comprehensive Metabolic Panel_    _LAB: Urinalysis (Complete)_    Clinical Notes: Same meds. Check above surveillance  labs. BP goal 135/85 or  less.        **7\. Routine medical exam**    Clinical Notes: Immunizations and mamogram updated. Colonoscopy advised at age  15. Pap Smear every 3 years if HPV negative. Advised reduced red meat intake  to max 2x a week due to associated risk with colon cancer. healthy diet and  moderate cardiovascualr exercise at least 200 minutes week advised. Self skin  exam, report changes and sunscreen daily use. Influenza immunization yearly.  Shingrix discussed patient to check coverage with insurance.        **8\. Chronic diastolic heart failure**    Clinical Notes: Stable...no signs of CHF today.        **9\. Synovitis of right hand**    _LAB: C-React Prot (CRP)_  _LAB: Sedimentation Rate_    _LAB: Lyme Abs_    _LAB: Antinuclear Antibody Screen_    _LAB: Rheumatoid Factor, Quantitative_    Clinical Notes: concerned for rheumatological process...we will chwck above  labs.      Preventive Medicine    ---      Counseling:    Diet .    Exercise .    Sunscreen .    Pap smear .    Seatbelts .    Calcium/Vitamin D Supplement .    ---    Self breast exams, routine eye exams.        ---      Follow Up    ---    6 Months, 1 Year    Electronically signed by Mardene Sayer , M.D. on 03/20/2017 at 06:04 PM EDT    Sign off status: Completed        * * *        [facility]    [address]    [place], [place] [address]    Tel: [phone]    Fax: [phone]              * * *          Patient: Emma [name], CASPERS DOB: Jun 01, 1956 Progress Note: Mardene Sayer,  M.D. 02/19/2017    ---    Note generated by eClinicalWorks EMR/PM Software ([email or web address])

## 2017-03-05 ENCOUNTER — Ambulatory Visit

## 2017-03-06 ENCOUNTER — Ambulatory Visit: Admitting: Internal Medicine

## 2017-03-06 LAB — HX .AUTOMATED DIFF
CASE NUMBER: 2018157000975
HX ABSOLUTE NEUTRO COUNT: 4060 /mm3
HX BASOPHILS: 1 % — NL (ref 0.0–1.0)
HX EOSINOPHILS: 5 % — ABNORMAL HIGH (ref 0.0–3.0)
HX IMMATURE GRANULOCYTES: 0 % — NL (ref 0.0–2.0)
HX LYMPHOCYTES: 25 % — NL (ref 22.0–40.0)
HX MONOCYTES: 8 % — NL (ref 0.0–11.0)
HX NEU CT MEASURED: 4.06
HX NEUTROPHILS: 60 % — NL (ref 40.0–71.0)

## 2017-03-06 LAB — HX URINALYSIS (COMPLETE)
CASE NUMBER: 2018157000976
HX UA BILIRUBIN: NEGATIVE — NL
HX UA BLOOD: NEGATIVE — NL
HX UA GLUCOSE: NEGATIVE — NL
HX UA KETONES: NEGATIVE — NL
HX UA NITRITE: POSITIVE — AB
HX UA PH: 5 — NL (ref 5.0–8.0)
HX UA PROTEIN: NEGATIVE — NL
HX UA RBC: 2 /HPF — NL (ref 0.0–3.0)
HX UA SPECIFIC GRAVITY: 1.023 — NL (ref 1.005–1.03)
HX UA SQUAMOUS EPITHELIAL: 3 /HPF — NL (ref 0.0–5.0)
HX UA UROBILINOGEN: 4 — AB
HX UA WBC: 12 /HPF — ABNORMAL HIGH (ref 0.0–5.0)

## 2017-03-06 LAB — HX CBC W/ DIFF
CASE NUMBER: 2018157000975
HX HCT: 41 % — NL (ref 36.0–47.0)
HX HGB: 13.6 g/dL — NL (ref 11.8–15.8)
HX MCH: 30 pg — NL (ref 27.0–31.0)
HX MCHC: 33.2 g/dL — NL (ref 32.0–36.0)
HX MCV: 90 fL — NL (ref 81.0–99.0)
HX MPV: 9.4 fL — NL (ref 7.4–11.5)
HX NRBC PERCENT: 0 % — NL
HX PLATELET: 312 10*3/uL — NL (ref 150.0–400.0)
HX RBC: 4.53 10*6/uL — NL (ref 3.6–5.0)
HX RDW: 13.7 % — NL (ref 11.5–14.5)
HX WBC: 6.8 10*3/uL — NL (ref 3.7–11.2)

## 2017-03-06 LAB — HX COMPREHENSIVE METABOLIC PANEL
CASE NUMBER: 2018157000975
HX ALBUMIN LVL: 3.6 g/dL — NL (ref 3.5–5.0)
HX ALKALINE PHOSPHATASE: 108 U/L — NL (ref 45.0–117.0)
HX ALT: 29 U/L — NL (ref 6.0–78.0)
HX ANION GAP: 7 — NL (ref 3.0–11.0)
HX AST: 21 U/L — NL (ref 6.0–40.0)
HX BILIRUBIN TOTAL: 0.7 mg/dL — NL (ref 0.2–1.2)
HX BUN: 16 mg/dL — NL (ref 7.0–18.0)
HX CALCIUM LVL: 9.5 mg/dL — NL (ref 8.5–10.1)
HX CHLORIDE: 105 mmol/L — NL (ref 98.0–110.0)
HX CO2: 30 mmol/L — NL (ref 21.0–32.0)
HX CREATININE: 0.702 mg/dL — NL (ref 0.55–1.3)
HX GLUCOSE LVL: 79 mg/dL — NL (ref 65.0–110.0)
HX POTASSIUM LVL: 3.9 mmol/L — NL (ref 3.6–5.2)
HX SODIUM LVL: 142 mmol/L — NL (ref 136.0–145.0)
HX TOTAL PROTEIN: 7.1 g/dL — NL (ref 6.0–8.0)

## 2017-03-06 LAB — HX GLOMERULAR FILTRATION RATE (ESTIMATED)
CASE NUMBER: 2018157000975
HX AFN AMER GLOMERULAR FILTRATION RATE: >90
HX NON-AFN AMER GLOMERULAR FILTRATION RATE: >90

## 2017-03-06 LAB — HX RHEUMATOID FACTOR, QUANTITATIVE
CASE NUMBER: 2018157000975
HX RHEUMATOID FACTOR, QUANTITATIVE: <10 — NL (ref 0.0–15.0)

## 2017-03-06 LAB — HX C-REACTIVE PROTEIN (CRP)
CASE NUMBER: 2018157000975
HX C-REACTIVE PROTEIN: 1.76 mg/dL — ABNORMAL HIGH (ref 0.0–0.8)

## 2017-03-06 LAB — HX SEDIMENTATION RATE
CASE NUMBER: 2018157000975
HX SED RATE: 40 mm/h — ABNORMAL HIGH (ref 0.0–30.0)

## 2017-03-07 LAB — HX HEMOGLOBIN A1C
CASE NUMBER: 2018157000975
HX EST AVERAGE GLUCOSE (EAG): 108 mg/dL
HX HEMOGLOBIN A1C: 5.4 % — NL
HX LA1C (INTERNAL): 1.8 % — NL
HX P3 PEAK (INTERNAL): 3.7 % — NL
HX P4 PEAK (INTERNAL): 1.2 % — NL
HX TOTAL AREA RANGE (INTERNAL): 2.66 microvolt/sec — NL (ref 1.0–3.5)

## 2017-03-08 LAB — HX .ANTINUCLEAR ANTIBODIES TITER
CASE NUMBER: 2018157000975
HX ANA TITER: 1:80 {titer} — AB

## 2017-03-08 LAB — HX LYME ANTIBODIES W/ RFLX IB
CASE NUMBER: 2018157000975
HX LYME ABS TOT: 0.38 {index}

## 2017-03-08 LAB — HX ANTINUCLEAR ANTIBODY SCREEN
CASE NUMBER: 2018157000975
HX ANA: POSITIVE — AB

## 2017-05-13 ENCOUNTER — Ambulatory Visit: Admitting: Cardiovascular Disease

## 2017-05-13 NOTE — Progress Notes (Signed)
.  Progress Notes  .  Patient: Emma [name]  Provider: Guerry Minors  DOB:05-06-1956 Age: 61 Y Sex: Female  .  PCP: Mardene Sayer  MD  Date: 05/13/2017  .  --------------------------------------------------------------------------------  .  REASON FOR APPOINTMENT  .  1. Atrial fibrillation, s/p PVI 01/18/17  .  HISTORY OF PRESENT ILLNESS  .  Cardiology:  I had the pleasure of seeing Ms.  Emma [name] on follow up at [facility] on Monday 05/13/2017 in the company of her  husband. 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 After failing  flecainide, she was referred for ablation of her paroxysmal  atrial fibrillation. The risk factors she has are obesity, sleep  apnea, and hypertension. On 01/18/2017, she underwent pulmonary  vein isolation using cryoablation. There was some difficulty in  isolating the left superior pulmonary vein. Even after it was  isolated pacing or mechanical trauma to the left atrium led to  the precipitation of atrial fibrillation. In light of this, she  was advised to resume the flecainide that she had been on prior  to the ablation, albeit at higher dose. She had been on 50 mg  twice a day, she was hospitalized for 2 days during which her  dose of flecainide was raised to 100 mg twice a day. She  tolerated this well and in fact converted spontaneously back into  sinus rhythm after organizing into an atrial tachycardia.Today  Emma [name] reports that she has been feeling well overall. She  did have an incident after a few stressful days early this past  Saturday morning (05/11/17) around 3am of chest pressure and  palpitations. She felt her pulse and it was not rapid. She  remained awake for a while with it then took benadryl and  ibuprofen before going back to sleep. When she awoke, her  symptoms had resolved. This was the only  incident of symptoms  since her ablation. She has OSA and is compliant with CPAP. Since  she was getting some vertigo on 100mg  BID flecainide, it has been  decreased it to 50mg  BID.  Marland Kitchen  Associated Providers:  Primary Care Provider  Dr. Mardene Sayer.  Referring Providers  Dr. Homero Fellers.  Marland Kitchen  CURRENT MEDICATIONS  .  Taking Flecainide Acetate 50 mg Tablet Orally twice daily  Taking Furosemide 40 MG Once a day  Taking Loratadine 10 MG Tablet 1 tablet Orally Once a day  Taking Metoprolol Succinate ER 25 mg Tablet Extended Release 24  Hour 1 tablet Orally bid  Taking Metoprolol Tartrate 25 mg Tablet 1 tablet with food Orally  prn  Taking Prevagen Extra Strength 20 MG Capsule Orally  Taking Xarelto 20 MG Tablet 1 tablet with food Orally Once a day  Medication List reviewed and reconciled with the patient  .  PAST MEDICAL HISTORY  .  Paroxysmal Atrial fibrillation  Hypertension  Osteoarthritis  Obesity  Spinal Stenosis  OSA - on CPAP which she uses consistently  .  ALLERGIES  .  ace inhibitors  .  SURGICAL HISTORY  .  Caesarian Section  Rhinoplasty  Knee Replacement  .  FAMILY HISTORY  .  Father Deceased 40 years: CAD, MI, AAAMother: Deceased 24 years,  died of CA: lymphoma, sarcoma with METSSister: CAD, HTN.  .  SOCIAL HISTORY  .  Quit Smoking  in 1980sNo Illicit drugs.  Marland Kitchen  REVIEW OF SYSTEMS  .  Cardiology - [facility]:  .  Constitutional    + weight gain or loss in the past year, no  night sweats or fevers, no loss of appetite, no feelings of  fatigue . Cardiovascular    no chest discomfort or pain, + edema  of the feet or ankles, no shortness of breath, + palpitations, no  syncope, no presyncope . Ear/Nose/Mouth/Throat    no hoarseness  or change in voice, no hearing difficulty or ringing in ears, no  nasal congestion, no post-nasal drip, no nose bleeds, no loss of  smell . Hematology    + easy bruising, no difficulty with  stopping bleeding . Musculoskeletal    no joint pain or swelling,  no back pain, no arm or leg  weakness . Neurology/Psychiatry    no  dizziness or lightheadedness, no unsteady gait, no numbness or  tingling sensation, no headaches, no forgetfulness, no anxious or  nervous feelings, no insomnia, no feeling of being too sleepy all  the time . Respiratory    no cough, no sputum or phlegm  production, no blood in sputum, no difficulty breathing with  activity or rest, no wheezing, no loud snoring .  Marland Kitchen  VITAL SIGNS  .  Pain scale 0, Ht-in 65, Wt-lbs 329, BMI 54.74, BP 150/76, HR 60,  RR 16, BSA 2.61, O2 97, Wt-kg 149.23, Wt Change -1 lb.  Marland Kitchen  EXAMINATION  .  Cardiac Testing:  EKG:(05/13/2017 ): SR 61 bpm, IVCD 100 ms., QTc 448 ms.  Echo:  .  01/21/2017  Left Ventricle The left ventricle is normal in size. Mildly  increased left ventricular wall thickness. Left ventricular  systolic function is normal. Ejection Fraction = 55%. No regional  wall motion abnormalities noted.  Right Ventricle The right ventricle is mildly dilated. The right  ventricular systolic function is low normal.  Atria The left atrium is moderately dilated. The right atrium is  mild to moderately dilated. The inferior vena cava is normal size  with > 50% respirophasic variation, consistent with normal right  atrial pressure.  Mitral Valve The mitral valve is normal. There is mild mitral  annular calcification. There is trace mitral regurgitation.  Tricuspid Valve The tricuspid valve is not well visualized, but  is grossly normal. There is trace tricuspid regurgitation.  Aortic Valve The aortic valve is mildly thickened. The aortic  valve is trileaflet. The aortic valve opens well. Trace aortic  regurgitation.  Pulmonic Valve The pulmonic valve is not well seen, but is  grossly normal. Trace pulmonic valvular regurgitation.  Great Vessels The aortic root is normal size.  Pericardium/Pleural There is a possible trivial pericardial  effusion near the right atrium. There are no echocardiographic  indications of cardiac tamponade.  Interpretation  Summary I have personally reviewed the study and  confirm the fellow's findings. Please see full report for  findings. Study requested by clinical service to assess for  pericardial effusion in a patient who developed pleuritic chest  pain post Afib ablation. There is normal biventricular function.  There is a possible trivial pericardial effusion near the right  atrium. There are no echocardiographic indications of cardiac  tamponade. No prior transthoracic study is available for  comparison. There is no significant change compared to prior TEE  of 01/17/2017.  Marland Kitchen  PHYSICAL EXAMINATION  .  Exam:  General  no apparent distress. HEENT  PERRLA, EOMI, no JVD,  carotid pulses  2+ bilaterally, no bruits. Cardiovascular  regular  rate and rhythm, clear S1 and S2, no murmurs, no gallops, no  rubs, no heaves, no thrills. Pulmonary  clear to auscultation and  percussion, no wheezing, no rhonchi, no rales. Abdomen  soft, non  tender, non distended, no organomegaly. Extremities  warm, well  perfused, no edema, no clubbing, no cyanosis. Neurologic  non  focal.  .  ASSESSMENTS  .  Atrial fibrillation - I48.91 (Primary), By her own, assessment,  this is the longest period of time that she has been free of  atrial fibrillation. I was concerned post ablation about the  long-term possibility of her remaining in sinus rhythm given her  obesity and obstructive sleep apnea. However, she appears to have  remained in sinus rhythm with no recurrence of her once frequent  paroxysmal atrial fibrillation. The long-term result of her PVI  remains to be determined. I suspect that there will be a  recurrence. Hence, I have maintained her on flecainide at the  same dose she had been on prior to the ablation 50 mg twice a  day.  .  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 She may need the  addition of an ACEI/ARB. More  important, I anticipate that with  weight loss and better control of her sleep apnea, her blood  pressure will fall under better control  .  TREATMENT  .  Atrial fibrillation  Continue Flecainide Acetate Tablet, 50 mg, Orally, twice daily  Continue Metoprolol Succinate ER Tablet Extended Release 24 Hour,  25 mg, 1 tablet, Orally, bid  Continue Metoprolol Tartrate Tablet, 25 mg, 1 tablet with food,  Orally, prn  Continue Xarelto Tablet, 20 MG, 1 tablet with food, Orally, Once  a day  .  Marland Kitchen  Hypertension  Continue Furosemide 40 MG, Once a day  .  FOLLOW UP  .  6 Months  .  Electronically signed by Guerry Minors , MD on  05/17/2017 at 11:44 AM EDT  .  Document electronically signed by Guerry Minors    .

## 2017-05-13 NOTE — Progress Notes (Signed)
* * *        **Emma [name]**    --- ---    74 Y old Female, DOB: April 29, 1956, External MRN: [id number]    Account Number: [id number]    [address], [place], [place]    Home: [phone]    Insurance: HMO BLUE OUT IPA Payer ID: PAPER    PCP: Emma Sayer, MD Referring: Emma [name] External Visit ID:  [id number]    Appointment Facility: [facility]        * * *    05/13/2017  Progress Notes: Emma Minors, MD **CHN#:** [id number]    --- ---    ---        Current Medications    ---    Taking     * Flecainide Acetate 50 mg Tablet Orally twice daily    ---    * Furosemide 40 MG Once a day    ---    * Loratadine 10 MG Tablet 1 tablet Orally Once a day    ---    * Metoprolol Succinate ER 25 mg Tablet Extended Release 24 Hour 1 tablet Orally bid    ---    * Metoprolol Tartrate 25 mg Tablet 1 tablet with food Orally prn    ---    * Prevagen Extra Strength 20 MG Capsule Orally     ---    * Xarelto 20 MG Tablet 1 tablet with food Orally Once a day    ---    * Medication List reviewed and reconciled with the patient    ---      Past Medical History    ---       Paroxysmal Atrial fibrillation.        ---    Hypertension.        ---    Osteoarthritis.        ---    Obesity.        ---    Spinal Stenosis.        ---    OSA - on CPAP which she uses consistently.        ---      Surgical History    ---      Caesarian Section    ---    Rhinoplasty    ---    Knee Replacement    ---      Family History    ---      Father Deceased 81 years: CAD, MI, AAA    Mother: Deceased 42 years, died of CA: lymphoma, sarcoma with METS    Sister: CAD, HTN.    ---      Social History    ---      Quit Smoking in 1980s    No Illicit drugs.    ---      Allergies    ---      ace inhibitors    ---    [Allergies Verified]      Review of Systems    ---     _Cardiology - [facility]_ :    Constitutional \+ weight gain or loss in the past year, no night sweats or  fevers, no loss of appetite, no feelings of fatigue.  Cardiovascular no chest  discomfort or pain, \+ edema of the feet or ankles, no shortness of breath, \+  palpitations, no syncope, no presyncope. Ear/Nose/Mouth/Throat no hoarseness  or change in voice, no hearing difficulty or  ringing in ears, no nasal  congestion, no post-nasal drip, no nose bleeds, no loss of smell. Hematology  + easy bruising, no difficulty with stopping bleeding. Musculoskeletal no  joint pain or swelling, no back pain, no arm or leg weakness.  Neurology/Psychiatry no dizziness or lightheadedness, no unsteady gait, no  numbness or tingling sensation, no headaches, no forgetfulness, no anxious or  nervous feelings, no insomnia, no feeling of being too sleepy all the time.  Respiratory no cough, no sputum or phlegm production, no blood in sputum, no  difficulty breathing with activity or rest, no wheezing, no loud snoring.            Reason for Appointment    ---      1\. Atrial fibrillation, s/p PVI 01/18/17    ---      History of Present Illness    ---     _Cardiology_ :    I had the pleasure of seeing [name] on follow up at [facility] on Monday 05/13/2017 in the company of her  husband.    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 After failing flecainide, she was referred for ablation of her  paroxysmal atrial fibrillation. The risk factors she has are obesity, sleep  apnea, and hypertension. On 01/18/2017, she underwent pulmonary vein isolation  using cryoablation. There was some difficulty in isolating the left superior  pulmonary vein. Even after it was isolated pacing or mechanical trauma to the  left atrium led to the precipitation of atrial fibrillation. In light of this,  she was advised to resume the flecainide that she had been on prior to the  ablation, albeit at higher dose. She had been on 50 mg twice a day, she  was  hospitalized for 2 days during which her dose of flecainide was raised to 100  mg twice a day. She tolerated this well and in fact converted spontaneously  back into sinus rhythm after organizing into an atrial tachycardia.    Today Emma [name] reports that she has been feeling well overall. She did  have an incident after a few stressful days early this past Saturday morning  (05/11/17) around 3am of chest pressure and palpitations. She felt her pulse  and it was not rapid. She remained awake for a while with it then took  benadryl and ibuprofen before going back to sleep. When she awoke, her  symptoms had resolved. This was the only incident of symptoms since her  ablation. She has OSA and is compliant with CPAP. Since she was getting some  vertigo on 100mg  BID flecainide, it has been decreased it to 50mg  BID.     _Associated Providers_ :    Primary Care Provider Dr. Mardene [name]. Referring Providers Dr. Homero [name].      Vital Signs    ---    Pain scale 0, Ht-in 65, Wt-lbs 329, BMI 54.74, BP 150/76, HR 60, RR 16, BSA  2.61, O2 97, Wt-kg 149.23, Wt Change -1 lb.      Examination    ---     _Cardiac Testing_ :    EKG: (05/13/2017 ): SR 61 bpm, IVCD 100 ms., QTc 448 ms.    Echo: ****    **01/21/2017    Left Ventricle **The left ventricle is normal in size. Mildly increased left  ventricular  wall thickness. Left ventricular systolic function is normal.  Ejection Fraction = 55%. No regional wall motion abnormalities noted.    **Right Ventricle** The right ventricle is mildly dilated. The right  ventricular systolic function is low normal.    **Atria** The left atrium is moderately dilated. The right atrium is mild to  moderately dilated. The inferior vena cava is normal size with > 50%  respirophasic variation, consistent with normal right atrial pressure.    **Mitral Valve** The mitral valve is normal. There is mild mitral annular  calcification. There is trace mitral regurgitation.    **Tricuspid  Valve** The tricuspid valve is not well visualized, but is grossly  normal. There is trace tricuspid regurgitation.    **Aortic Valve** The aortic valve is mildly thickened. The aortic valve is  trileaflet. The aortic valve opens well. Trace aortic regurgitation.    **Pulmonic Valve** The pulmonic valve is not well seen, but is grossly normal.  Trace pulmonic valvular regurgitation.    **Great Vessels** The aortic root is normal size.    **Pericardium/Pleural** There is a possible trivial pericardial effusion near  the right atrium. There are no echocardiographic indications of cardiac  tamponade.    **Interpretation Summary** I have personally reviewed the study and confirm  the fellow's findings. Please see full report for findings. Study requested by  clinical service to assess for pericardial effusion in a patient who developed  pleuritic chest pain post Afib ablation. There is normal biventricular  function. There is a possible trivial pericardial effusion near the right  atrium. There are no echocardiographic indications of cardiac tamponade. No  prior transthoracic study is available for comparison. There is no significant  change compared to prior TEE of 01/17/2017.          Physical Examination    ---     _Exam_ :    General no apparent distress. HEENT PERRLA, EOMI, no JVD, carotid pulses 2+  bilaterally, no bruits. Cardiovascular regular rate and rhythm, clear S1 and  S2, no murmurs, no gallops, no rubs, no heaves, no thrills. Pulmonary clear to  auscultation and percussion, no wheezing, no rhonchi, no rales. Abdomen soft,  non tender, non distended, no organomegaly. Extremities warm, well perfused,  no edema, no clubbing, no cyanosis. Neurologic non focal.          Assessments    ---    1\. Atrial fibrillation - I48.91 (Primary), By her own, assessment, this is  the longest period of time that she has been free of atrial fibrillation. I  was concerned post ablation about the long-term possibility of her  remaining  in sinus rhythm given her obesity and obstructive sleep apnea. However, she  appears to have remained in sinus rhythm with no recurrence of her once  frequent paroxysmal atrial fibrillation. The long-term result of her PVI  remains to be determined. I suspect that there will be a recurrence. Hence, I  have maintained her on flecainide at the same dose she had been on prior to  the ablation 50 mg twice a day.    ---    2\. 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 She may need the addition of an ACEI/ARB. More  important, I anticipate that with weight loss and better control of her sleep  apnea, her blood pressure will fall under better  control    ---      Treatment    ---       **1\. Atrial fibrillation**    Continue Flecainide Acetate Tablet, 50 mg, Orally, twice daily    Continue Metoprolol Succinate ER Tablet Extended Release 24 Hour, 25 mg, 1  tablet, Orally, bid    Continue Metoprolol Tartrate Tablet, 25 mg, 1 tablet with food, Orally, prn    Continue Xarelto Tablet, 20 MG, 1 tablet with food, Orally, Once a day    ---         **2\. Hypertension**    Continue Furosemide 40 MG, Once a day      Follow Up    ---    6 Months    Electronically signed by Emma [name] , MD on 05/17/2017 at 11:44 AM EDT    Sign off status: Completed        * * *        [name]    [address]    [place], [place] [address]    Tel: [phone]    Fax: [phone]              * * *          Patient: Emma [name], Emma [name] DOB: 08-13-1956 Progress Note: Emma Minors, MD  05/13/2017    ---    Note generated by eClinicalWorks EMR/PM Software ([email or web address])

## 2017-05-17 ENCOUNTER — Ambulatory Visit (HOSPITAL_BASED_OUTPATIENT_CLINIC_OR_DEPARTMENT_OTHER): Admitting: Cardiovascular Disease

## 2017-05-17 ENCOUNTER — Ambulatory Visit

## 2017-05-17 ENCOUNTER — Ambulatory Visit: Admitting: Internal Medicine

## 2017-05-20 ENCOUNTER — Ambulatory Visit

## 2017-06-10 ENCOUNTER — Ambulatory Visit: Admitting: Cardiovascular Disease

## 2017-06-10 NOTE — Progress Notes (Signed)
* * *         [facility], [facility]**        ---    Lawernce Ion. Sindy Messing, MD [facility] Kieth Brightly. Donnetta Simpers, MD [facility] Barbera Setters Byrd Hesselbach, MD  [facility];    Darlyn Chamber, MD [facility] Roosvelt Maser, MD [facility] Shanon Brow, MD [facility]  Nile Dear. Audley Hose, MD [facility]    Kandis Mannan A. Karie Mainland, MD [facility] Johnell Comings. MacNaught, MD [facility] Mitchel Honour, MD Erma Pinto, MD [facility]    Colletta Maryland, MD Weston Anna, MD [facility] Kerby Moors, NP Maximino Greenland ,  NP        * * *     **Patient Name:** Emma [name]   **Date:** 06/10/2017    --- ---     **DOB:** 21-Sep-1956     **Referring Provider:** Mardene Sayer   **Appointment Provider:**  [name], MD        * * *    06/10/2017  Progress Notes: [name], MD    --- ---    ---        Current Medications    ---    Taking     * furosemide 40 mg tablet 1 tab(s) orally B.I.D.    ---    * Xarelto 20 mg tablet 1 tab(s) orally Q.D.    ---    * flecainide 50 mg tablet 1 tab(s) orally B.I.D.    ---    * Metoprolol Tartrate 25 mg tablet 1 tab(s) orally PRN for recurrent afib    ---    * loratadine 10 mg tablet 1 tab(s) orally once a day    ---    * Tylenol Extra Strength 500 mg tablet 2 tab(s) orally PRN    ---    * Metoprolol Succinate ER 25 mg tablet, extended release 1 tab(s) orally B.I.D.    ---    * Medication List reviewed and reconciled with the patient    ---      Past Medical History    ---      PAF - PVI procedure at [facility] 01/18/17 apparently unsuccessful, flecainide  doubled to 100 bid - postop complicated by pericarditis on colchicine    ---    HTN    ---    Obesity    ---    Osteoarthritis    ---    Spinal stenosis    ---    Fasle positive stress test with normal coronaries on cath 12/2016    ---      Surgical History    ---      C section    ---    rhinoplasty    ---    knee replacement    ---      Family History    ---      Father: deceased 39 yrs, CAD, MI, AAA    ---    Mother: deceased 55 yrs, Died of CA, initially lymphoma then sarcomas in leg  with  mets    ---    Siblings: sister- HTN, CAD    ---      Social History    ---    Tobacco Status: former smoker, How long has it been since you last smoked? >  10 years, Discontinued: 27s, Patient counseled on the dangers of tobacco  use: 03/29/2016.    Advised to lose weight: Yes Discussed elevated BMI with the patient and  importance of  losing weight for cardiovascular and general overall health,  Discussed elevated BMI with the patient and importance of losing weight for  cardiovascular and general overall health.    No Have you fallen in the past year?.    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    ---      Allergies    ---      ace inhibitors    ---      Review of Systems    ---     _._ :    no Change in Vision. no Major change in weight. no Nausea. no vomitting. no  Diarrhea. no hematemesis. no Blood in urine. no blood in stool. no black,  tarry stool. no focal weakness, numbness or tingling. no rashes. no  hallucinations. no fevers, chills. no decrease in appetite. no Intrascapular  pain. no Bruising. no abdominal pain.            Reason for Appointment    ---      1\. f/u PAF, HTN, followed by Dr. Ruffin Frederick    ---      History of Present Illness    ---     _History of Present Illness_ :    61 year old with a history of PAF, hypertension, and obesity, followed by Dr.  Ruffin Frederick, who presents for followup. She was last seen 4 months ago by NP  Rooks. Since her last visit, doing relatively well without issues. Has had  some irregular heart beats but not rapid and would disappear after a few  hours. Otherwise gained some weight. No chest pain, or SOB, nausea, vomiting,  diaphoreiss,PND, orthopnea, lightheadedness, edema or syncope. Seen by Dr.  Malcolm Metro and doing well.      Vital Signs    ---    HR 50, BP 144/86, Wt 333, Ht 65, BMI 55.41, Repeat BP 148/90, Med Assist: KV.      Examination    ---     _HS_ :    General: pleasant, obese, middle aged female, in no distress. ENMT EOMI,  anicteric,  MMM, OP clear. Neck: no JVD, bruits, or lymphadenopathy. Pulmonary  System clear to auscultation bilaterally, no wheezes or crackles. Cardiac: PMI  is nondisplaced, normal S1 S2, regular rate, no murmur, gallop or pericardial  rub. GI SYSTEM obese, soft, NT/ND, BS present, no guarding or rigidity.  Extremities no clubbing, no edema, + lymphedema. Peripheral pulses: normal  (2+) bilaterally. Skin: normal, no rash. Neurologic exam: grossly non-focal.  Pyschiatric appropriate.          Data    ---     _EKG (reviewed personally)_ :    06/10/17: sinus rhythm, 50, borderline IVCD, QTC .463.    _Lipids_ :    4/16: TC 214 TRI 103 HDL 60 LDL 133. 3/15: TC 214, TG 68, HDL 58, LDL 142.    _Lexiscan Myoview_ :    2012: normal.          Impression/Recommendations    ---       **1\. Paroxysmal atrial fibrillation**    Clinical Notes: Stable on flecainide and Xarelto. Did not entirely like the  way she felt on higher dose flecainide and will continue as prescribed.  Followed by Dr. Malcolm Metro.    ---        **2\. Hypercholesterolemia**    Notes: Patient Educated with: AHANutritionSheet.pdf (AHANutritionSheet.pdf).        **3\. Essential (primary) hypertension**    Start  Cozaar tablet, 25 mg, 1 tab(s), orally, once a day, 30 day(s), 30,  Refills 5    Clinical Notes: Previously on losartan, now off. Will resume and will have f/u  in 2 months with NP for following BPs.        Diagnostic Imaging    --- ---    Ardine Bjork: **EKG_    ---       Procedure Codes    ---      93000 IH    ---      Follow Up    ---    2 Months with NP, 6 months with me    Electronically signed by Homero Fellers MD on 06/10/2017 at 12:24 PM EDT    Sign off status: Completed        * * *        [facility]    [address]    [place], [place] [address]    Tel: [phone]    Fax: [phone]              * * *          Patient: ANAISHA, MAGO DOB: 23-Aug-1956 Progress Note: [name], MD 06/10/2017    ---    Note generated by  eClinicalWorks EMR/PM Software ([email or web address])

## 2017-06-11 ENCOUNTER — Ambulatory Visit: Admitting: Internal Medicine

## 2017-07-10 ENCOUNTER — Ambulatory Visit (HOSPITAL_BASED_OUTPATIENT_CLINIC_OR_DEPARTMENT_OTHER): Admitting: Cardiovascular Disease

## 2017-07-30 ENCOUNTER — Ambulatory Visit: Admitting: Cardiovascular Disease

## 2017-07-30 NOTE — Progress Notes (Signed)
* * *        **  [name]    --- ---    83 Y old Female, DOB: 07-24-1956    [address] [place], [place] [address]    Home: [phone]    Provider: [name], MD        * * *    Telephone Encounter    ---    Answered by   Mary Sella  Date: 07/30/2017         Time: 10:29 AM    Reason   no show NP .    --- ---            Message                      r/s from 10/25--Gina                Action Taken   Carlson,Dyann 07/30/2017 4:27:37 PM > LMOM to return my call  Carlson,Dyann 07/30/2017 5:00:04 PM > r/s 09/03/17                * * *                ---          * * *          Patient: [name] DOB: 1955-11-08 Provider: [name],  MD 07/30/2017    ---    Note generated by eClinicalWorks EMR/PM Software ([email or web address])

## 2017-08-29 ENCOUNTER — Ambulatory Visit: Admitting: Internal Medicine

## 2017-08-29 NOTE — Progress Notes (Signed)
* * *        Emma [name]**    ------    11 Y old Female, DOB: 05-19-1956    Account Number: [id number]    [address], [place], [id number]    Home: [phone]    Guarantor: Emma [name] Insurance: Bc/bs [employer] Blue Payer ID: [id number]    Appointment Facility: [facility]        * * *    08/29/2017    Progress Note: Mardene Sayer, M.D.    ------    ---        **Current Medications**    ---    Taking      * flecainide 100 mg tablet 1 tab(s) orally BID     ---    * Tylenol Extra Strength 500 mg tablet 2 tab(s) orally every 6 hours prn     ---    * Xarelto 20 mg tablet 1 tab(s) orally once a day (in the evening)     ---    * Metoprolol Tartrate 25 mg tablet 1 tab(s) orally Twice a day     ---    * Lasix 40 mg tablet 1 tab(s) orally once a day     ---    * loperamide-simethicone 2 mg-125 mg tablet 1 tab(s) orally Daily as needed for diarrhea     ---    * loratadine 10 mg tablet 1 tab(s) orally once a day     ---    * losartan 25 mg tablet 1 tab(s) orally once a day     ---    * Medication List reviewed and reconciled with the patient     ---      Past Medical History    ---      Paroxysmal Atrial Firbrilation Dr Audley Hose .        ---    HTN .        ---    Obesity .        ---    O/A .        ---    hx of depression she was on Zoloft.        ---    Spinal Stenosis .        ---    2011 colonoscopy at age 26 normal . repeat at age 6.        ---    A. Fib. admit 08/2016.        ---    12/2016: A fib admit to [facility] for Ablation, complications, post ablatipon  Pericarditis. - Unsuccessful.        ---    12/2016: Cardiac Cath False POS stress test, normal Coronary arteries,  Diastolic HF w/ elevated heart filling pressures. normal LV systolic function.  .        ---      **Surgical History**    ---      C section    ---    Rhinoplasty 1875    ---    Tota left knee replacement 05/2011    ---      **Family History**    ---      Father: deceased 57 yrs, MI x2, diagnosed  with Heart Disease    ---    Mother: deceased 40 yrs, Hodgkins Lymphoma, Sarcoma, s/p cholecystectomy,  shingles    ---    Daughter 1: alive 24 yrs, [place]    ---  Daughter 2: alive 24 yrs, [place] [employer]    ---    Brother 1: alive, Chronic Schizophreni c, Mental Illness    ---    Brother 2: alive, HTN, Hypertension    ---    Sister 1: alive 72 yrs, HTN , CAD, Hypertension, Heart Disease    ---    Sister 2: alive 14 yrs, SLE , HTN, Hypertension    ---    2 brother(s) , 2 sister(s) - healthy. 2daughter(s) - healthy.    ---      **Social History**    ---    Tobacco Use    Are you a? _Former smoker quit at the age of 28_    Patient was counseled on the dangers of tobacco use: _11/29/2018_    Alcohol 1 per week.    Drug use: yes, Past use: marrijauana.Marland KitchenMarland KitchenPCP a couple times as a teen .    Marital Status: Married.    Children: two children.    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    Exercise: none....except physical therapy.    Sexually active: yes .    MOLST Form    Does patient have completed form? _No_    MOLST Form discussed on: _02/19/2015_    HCP    Does patient have a current proxy? _No_    HCP was discussed: _02/19/2015_    COPD Population Screener Total Score: 0.      **Gyn History**    ---    Last pap smear date 05/2015 Negative, HPV Negative.    Last mammogram date 11/2016 Negative.    Date of Last Period D and C 08/2012 ...linning thickness...Dr. Daphine Deutscher. in  [place].    Menarch Menopause 55, menarch .      **OB History**    ---    Total pregnancies 2\.    Total living children 2\.      **Allergies**    ---      Ace inhibitors: cough - Allergy    ---      **Hospitalization/Major Diagnostic Procedure**    ---      [facility] for A fib, Unsuccessful. 12/2016    ---    [facility] 06/2017    ---        **Reason for Appointment**    ---      1\. 6 mts    ---      **History of Present Illness**    ---    _Interim History_ :    61 year old female, PMH as noted below,  presents for f/up doing well motivated  to loose weight refues bariatric surgeyr. . Meds as per list. Last labs  reviewed..sleep apnea complint with cpap now see hsopitalizations this year  for cardioversion...toleating meds well/.    Stool cards- not done    Hep C Screen not done    Due for Mammogram 11/2017    Shingrix advised.      **Vital Signs**    ---    Wt 326, Ht 65, BP 132/78, **Repeat:130/70** , BMI  54.24  , HR 56, O2 Sat  95%RA.      **Examination**    ---    _General Examination_ :    General Appearance:  NAD  ,  alert and oriented  .    Oral cavity:  clear  ,  moist mucus membranes  .    HEENT:  Head - NC/AT  ,  PERRLA  ,  EOMI  ,  TM's clear and flat bilaterally  ,  Oropharynx clear with MMM  .    Neck, Thyroid :  supple  ,  no thyromegaly  ,  no lymphadenopathy  .    Heart:  RRR  ,  no murmurs  .    Lungs:  clear to auscultation bilaterally  ,  no wheezes/rhonchi/rales  .    Abdomen:  soft, NT/ND, BS present  .    Extremities:  no clubbing, no edema  .    Neurologic Exam:  non-focal exam  .          **Assessments**    ---    1\. Essential hypertension - I10 (Primary)    ---    2\. Screen for colon cancer - Z12.11 (Primary)    ---    3\. Unilateral primary osteoarthritis of first carpometacarpal joint, left  hand - M18.12    ---    4\. Unilateral primary osteoarthritis of first carpometacarpal joint, right  hand - M18.11    ---    5\. Pelvic pain - R10.2    ---    6\. Morbid obesity - E66.01    ---      **Treatment**    ---      **1\. Essential hypertension**    _LAB: 3 Gen Sensitive TSH_     This lab was reviewed by Mardene Sayer on  12/24/2018 at 21:51 PM EDT    ------    _LAB: Comprehensive Metabolic Panel_   This lab was reviewed by Mardene Sayer on 12/24/2018 at 21:51 PM EDT    ------    _LAB: Lipid Panel_   This lab was reviewed by Mardene Sayer on 12/24/2018  at 21:51 PM EDT    ------    _LAB: Urinalysis (Complete)_   This lab was reviewed by Mardene Sayer on  12/24/2018 at  21:51 PM EDT    ------        Clinical Notes:  continue same meds...she will retsrat exerivse and continue  with weigh tloss  .        **2\. Screen for colon cancer**    _LAB: stool cards_    Clinical Notes:  discussed colon cancer screening ...he opted for stool cards  .Marland KitchenMarland KitchenUnderstands risks of colon cancer and that Colonoscopy testing despite few  risks involved still is the gold standard screening method  .        **3\. Unilateral primary osteoarthritis of first carpometacarpal joint, left  hand**    Clinical Notes:  tylenol, topical meds. no sings of sinovitis  .        **4\. Pelvic pain**    _IMAGING: US Pelvis Non-OB Complete e-sch*_     Clements,Maria 10/18/2017  2:20:46 PM > This DI was reviewed by Mardene Sayer on 12/24/2018 at 21:50 PM  EDT    ------        Clinical Notes:  check [place], pelvic exam not reliable due to weigth  .        **5\. Morbid obesity**    _LAB: Cortisol Level_     This lab was reviewed by Mardene Sayer on  12/24/2018 at 21:51 PM EDT    ------    _LAB: Insulin Level Total_   This lab was reviewed by Mardene Sayer on  12/24/2018 at 21:51 PM EDT    ------    _LAB: Hemoglobin A1c_   This lab was reviewed by Mardene Sayer on  12/24/2018 at 21:50 PM EDT    ------        Clinical Notes:  Discussed etiology of obesity and discussed weight watchers.  Discussed surgical options as well. encouarged diet/exercise as well as  avoding processed foods. information given as well  .      **Follow Up**    ---    cpe    Electronically signed by Mardene Sayer , M.D. on 09/19/2017 at 04:33 AM EST    Sign off status: Completed        * * *        [facility]    [address]    3rd Floor, [address]    [place], [place] [address]    Tel: [phone]    Fax: [phone]              * * *          Patient: Emma [name], Emma [name] DOB: 1956/02/08 Progress Note: Mardene Sayer,  M.D. 08/29/2017    ---    Note generated by eClinicalWorks EMR/PM Software  ([email or web address])

## 2017-09-01 ENCOUNTER — Inpatient Hospital Stay
Admit: 2017-09-01 | Disposition: A | Discharge status: Home / Self Care | Source: Home / Self Care | Attending: Internal Medicine | Admitting: Internal Medicine

## 2017-09-01 LAB — HX .ABO/RH TYPE TUBE
CASE NUMBER: 2018336001204
HX ABORH: O POS — NL
HX ANTI-A: 0 — NL
HX ANTI-B: 0 — NL

## 2017-09-01 LAB — HX LACTIC ACID
CASE NUMBER: 2018336001232
HX LACTIC ACID LVL: 0.9 mmol/L — NL (ref 0.4–2.0)

## 2017-09-01 LAB — HX COMPREHENSIVE METABOLIC PANEL
CASE NUMBER: 2018336001164
HX ALBUMIN LVL: 3.6 g/dL — NL (ref 3.2–5.0)
HX ALKALINE PHOSPHATASE: 128 U/L — ABNORMAL HIGH (ref 30.0–117.0)
HX ALT: 27 U/L — NL (ref 6.0–55.0)
HX ANION GAP: 8 — NL (ref 3.0–11.0)
HX AST: 23 U/L — NL (ref 6.0–40.0)
HX BILIRUBIN TOTAL: 0.5 mg/dL — NL (ref 0.2–1.2)
HX BUN: 19 mg/dL — NL (ref 8.0–23.0)
HX CALCIUM LVL: 9.4 mg/dL — NL (ref 8.5–10.5)
HX CHLORIDE: 103 mmol/L — NL (ref 98.0–110.0)
HX CO2: 29 mmol/L — NL (ref 21.0–32.0)
HX CREATININE: 0.653 mg/dL — NL (ref 0.55–1.3)
HX GLUCOSE LVL: 100 mg/dL — NL (ref 70.0–110.0)
HX POTASSIUM LVL: 3.6 mmol/L — NL (ref 3.6–5.2)
HX SODIUM LVL: 140 mmol/L — NL (ref 136.0–146.0)
HX TOTAL PROTEIN: 7.6 g/dL — NL (ref 6.0–8.4)

## 2017-09-01 LAB — HX ZZANTIBODY SCREEN
CASE NUMBER: 2018336001204
HX ANTIBODY SCREEN: NEGATIVE — NL
HX G1: 0 — NL
HX G2: 0 — NL
HX G3: 0 — NL

## 2017-09-01 LAB — HX CBC W/ DIFF
CASE NUMBER: 2018336001164
HX ABSOLUTE NRBC COUNT: 0 10*3/uL
HX HCT: 41.1 % — NL (ref 36.0–47.0)
HX HGB: 13.9 g/dL — NL (ref 11.8–16.0)
HX MCH: 30.3 pg — NL (ref 26.0–34.0)
HX MCHC: 33.8 g/dL — NL (ref 31.0–37.0)
HX MCV: 89.5 fL — NL (ref 80.0–100.0)
HX MPV: 9.2 fL — ABNORMAL LOW (ref 9.4–12.4)
HX NRBC PERCENT: 0 % — NL
HX PLATELET: 317 10*3/uL — NL (ref 150.0–400.0)
HX RBC: 4.59 10*6/uL — NL (ref 3.9–5.2)
HX RDW-CV: 13.3 % — NL (ref 11.5–14.5)
HX RDW-SD: 43.8 fL — NL (ref 35.0–51.0)
HX WBC: 8.9 10*3/uL — NL (ref 3.7–11.2)

## 2017-09-01 LAB — HX BB TUBE TO HOLD
CASE NUMBER: 2018336001164
CASE NUMBER: 2018336001204

## 2017-09-01 LAB — HX .AUTOMATED DIFF
CASE NUMBER: 2018336001164
HX ABSOLUTE BASO COUNT: 0.06 10*3/uL — NL (ref 0.0–0.22)
HX ABSOLUTE EOS COUNT: 0.37 10*3/uL — NL (ref 0.0–0.45)
HX ABSOLUTE LYMPHS COUNT: 1.89 10*3/uL — NL (ref 0.74–5.04)
HX ABSOLUTE MONO COUNT: 0.6 10*3/uL — NL (ref 0.0–1.34)
HX ABSOLUTE NEUTRO COUNT: 5.97 10*3/uL — NL (ref 1.48–7.95)
HX BASOPHILS: 0.7 %
HX EOSINOPHILS: 4.1 %
HX IMMATURE GRANULOCYTES: 0.3 % — NL (ref 0.0–2.0)
HX LYMPHOCYTES: 21.2 %
HX MONOCYTES: 6.7 %
HX NEUTROPHILS: 67 %

## 2017-09-01 LAB — HX GLOMERULAR FILTRATION RATE (ESTIMATED)
CASE NUMBER: 2018336001164
HX AFN AMER GLOMERULAR FILTRATION RATE: >90
HX NON-AFN AMER GLOMERULAR FILTRATION RATE: >90

## 2017-09-01 LAB — HX PTT
CASE NUMBER: 2018336001164
HX APTT: 38 s — ABNORMAL HIGH (ref 23.0–32.0)

## 2017-09-01 LAB — HX PT
CASE NUMBER: 2018336001164
HX INR: 1.2
HX PT: 12.3 s — ABNORMAL HIGH (ref 9.3–11.6)

## 2017-09-01 LAB — HX BLUE TOP TO HOLD: CASE NUMBER: 2018336001164

## 2017-09-01 NOTE — Progress Notes (Signed)
Subjective    has headache, waiting for colonoscopy, had redcell scan    Review of Systems    head ache, bowelprep    no weakness    Objective    Vitals & Measurements    **T:** 97.8 F (Oral) **TMIN:** 97.5 F (Oral) **TMAX:** 98.0 F (Oral)  **HR:** 55 **RR:** 18 **BP:** 165/80 **SpO2:** 98% **O2 Method (L/min):** Room  air **WT:** 146.8 Kg    Physical Exam    alert and oriented    heent atraumatic    heart s1s2    lungs clear    abd soft and non tender    msk no pedal edema    neuro intact    Medications    _Inpatient_    Ambien, 5 mg= 1 tab(s), PO, HS, PRN    flecainide, 50 mg= 1 tab(s), PO, BID    Lopressor, 25 mg= 1 tab(s), PO, BID    loratadine, 10 mg= 1 tab(s), PO, Daily, PRN    Losartan, 25 mg= 1 tab(s), PO, HS    morPHINE, 1 mg= 0.5 mL, IV Push, q4hr, PRN    Percocet 5/325 oral tablet, 1 tab(s), PO, q4hr, PRN    Sodium Chloride 0.45% 1,000 mL, 1000 mL, IV    Sodium Chloride 0.9% 50 mL + pantoprazole IV Additive 40 mg    Tylenol, 650 mg= 2 tab(s), PO, q6hr    Zofran, 4 mg= 2 mL, IM, q8hr, PRN    Lab Results    Glucose Lvl: 96 mg/dL (63/01/60 10:93:23 EST)    BUN: 18 mg/dL (55/73/22 02:54:27 EST)    Creatinine: 0.462 mg/dL Low (03/24/75 28:31:51 EST)    Afn Amer Glomerular Filtration Rate: >90 (09/02/17 05:29:00 EST)    Non-Afn Amer Glomerular Filtration Rate: >90 (09/02/17 05:29:00 EST)    Sodium Lvl: 144 mmol/L (09/02/17 05:29:00 EST)    Potassium Lvl: 3.6 mmol/L (09/02/17 05:29:00 EST)    Chloride: 108 mmol/L (09/02/17 05:29:00 EST)    CO2: 28 mmol/L (09/02/17 05:29:00 EST)    Anion Gap: 8 (09/02/17 05:29:00 EST)    Total Protein: 5.5 Gm/dL Low (76/16/07 37:10:62 EST)    Albumin Lvl: 2.8 Gm/dL Low (69/48/54 62:70:35 EST)    Calcium Lvl: 8.5 mg/dL (00/93/81 82:99:37 EST)    Phosphorus: 3.3 mg/dL (16/96/78 93:81:01 EST)    Magnesium Lvl: 2.1 mg/dL (75/10/25 85:27:78 EST)    Bilirubin Total: 0.6 mg/dL (24/23/53 61:44:31 EST)    Bilirubin Direct: 0.2 mg/dL (54/00/86 76:19:50 EST)    Alkaline  Phosphatase: 94 Units/L (09/02/17 05:29:00 EST)    AST: 17 Units/L (09/02/17 05:29:00 EST)    ALT: 20 Units/L (09/02/17 05:29:00 EST)    Lactic Acid Lvl: 0.8 mmol/L (09/02/17 05:29:00 EST)    WBC: 6.8 thous/mm3 (09/02/17 05:29:00 EST)    RBC: 3.66 Mil/mm3 Low (09/02/17 05:29:00 EST)    Hgb: 10.1 Gm/dL Low (93/26/71 24:58:09 EST)    Hct: 30.5 % Low (09/02/17 13:21:00 EST)    Platelet: 244 thous/mm3 (09/02/17 05:29:00 EST)    MCV: 89.1 fL (09/02/17 05:29:00 EST)    MCH: 30.3 pGm (09/02/17 05:29:00 EST)    MCHC: 34 Gm/dL (98/33/82 50:53:97 EST)    RDW-SD: 43.2 fL (09/02/17 05:29:00 EST)    MPV: 8.7 fL Low (09/02/17 05:29:00 EST)    Absolute Neutro Count: 5.97 thous/mm3 (09/01/17 18:49:00 EST)    Absolute Lymphs Count: 1.89 thous/mm3 (09/01/17 18:49:00 EST)    Absolute Mono Count: 0.6 thous/mm3 (09/01/17 18:49:00 EST)    Absolute Eos Count: 0.37 thous/mm3 (09/01/17  18:49:00 EST)    Absolute Baso Count: 0.06 thous/mm3 (09/01/17 18:49:00 EST)    Neutrophils: 67 % (09/01/17 18:49:00 EST)    Lymphocytes: 21.2 % (09/01/17 18:49:00 EST)    Monocytes: 6.7 % (09/01/17 18:49:00 EST)    Eosinophils: 4.1 % (09/01/17 18:49:00 EST)    Basophils: 0.7 % (09/01/17 18:49:00 EST)    Immature Granulocytes: 0.3 % (09/01/17 18:49:00 EST)    NRBC Percent: 0 % (09/02/17 05:29:00 EST)    Absolute NRBC Count: 0 thous/mm3 (09/02/17 05:29:00 EST)    ABORh: O POS (09/01/17 19:02:00 EST)    Antibody Screen: Negative (09/01/17 19:02:00 EST)    BBTTH: Done (09/01/17 19:02:00 EST)    PT: 12.3 sec High (09/01/17 18:49:00 EST)    INR: 1.2 (09/01/17 18:49:00 EST)    aPTT: 38 sec High (09/01/17 18:49:00 EST)    Blue Top Tube To Hold: DONE (09/01/17 18:49:00 EST)    Diagnostic Results    Findings: There are no focal areas of increased activity in the abdomen to    suggest GI bleeding.        Impression:    Normal exam.    [1]    Impression and Plan    lower GI bleeding    Rectal bleed    61 year old Caucasian woman with a medical history of atrial  fibrillation,  hypertension, and OA, who presents to the ER complaining of rectal bleeding.  on xarelto at home        Rectal bleed    redcell scan normal    plan for colonoscopy    xarelto on hold    scd for dvt prophylaxis    full code    [1] [facility]; Claris Gladden MD, Reuel Boom 09/02/2017 11:12  EST    SIGNATURE LINE Electronically signed by Einar Gip MD, Keedan Sample on 09/02/2017 at  15:47:31 EST

## 2017-09-01 NOTE — Discharge Summary (Signed)
Date of Admission    09/01/2017    Date of Discharge    09/04/2017    Code Status    Code Status - Ordered    -- 09/02/17 0:52:00 EST, Full Resuscitation, Constant Order    Allergies    atropine    ACE inhibitors (cough)    Social History    _Alcohol_    Current, Wine, 1-2 times per month    _Other_    coffee- half caf 2-3 cups a day; diet soda- occasionally    _Substance Abuse_ \- Denies Substance Abuse    Never    _Tobacco_ \- Denies Tobacco Use    Past, Cigarettes    Hospital Course    61 year old Caucasian woman with a medical history of atrial fibrillation,  hypertension, and OA, who presents to the ER complaining of rectal bleeding.  As per the patient she has been experiencing for the past 6 days moderate to  severe crampy like spasm in the pelvic cavity at the level of the sacral bone,  she has associated these complaints with a neuromuscular issue, and she has  been taking 2-3 ibuprofen pills every 6 to 8 hours. The afternoon of the  admission the patient is suddenly felt like if she were trying to pass a gas,  but she passed blood and clots, having several similar episodes in the next  couple of hours therefore she decided to come to the ER. She stated that she  had a colonoscopy at the age of 59, which only revealed diverticulosis. She  denies abdominal pain, fever, or chills. [1]        underwent EGD and colonoscopy results noted below    xarelto to be restarted after 48 hrs pt had no further episodes of bleeding    d/c home with follow    polyp biopsy rpeort to follow by PCP /GI    Procedures and Treatment Provided    IMPRESSION:    1\. Cecal polyps, status post biopsy.    2\. Extensive diverticular disease, left greater than right.    3\. Probable diverticular bleed, but no active bleeding site identified and no    localization of bleeding was able to be identified.        RECOMMENDATIONS: Given the patient's need for anticoagulation given atrial    fibrillation, we will proceed with EGD. Likely  she has had a self-limited    diverticular bleed. If there is active bleeding, she should have a repeat    tagged RBC scan or go straight for angio if the bleeding is significant. She    will stay on a clear liquid diet. I would hold Xarelto for 24 to 48 hours.    [2]    IMPRESSION: Mild duodenitis, otherwise normal upper endoscopy.        RECOMMENDATIONS: Clear liquid diet. Resume Xarelto in 48 hours if no further    bleeding. Tagged RBC scan versus angio for significant recurrent bleeding.    [3]    Findings: There are no focal areas of increased activity in the abdomen to    suggest GI bleeding.        Red cell NM scan    Impression:    Normal exam.    [4]    Physical Exam    Vitals & Measurements    **T:** 97.9 F (Oral) **TMIN:** 97.7 F (Oral) **TMAX:** 98.3 F (Oral)  **HR:** 60 (Peripheral Pulse) **RR:** 20 **BP:** 152/83 **SpO2:** 95% **O2  Method (L/min):**  Room air **WT:** 152 Kg    Lab Results    Glucose Lvl: 94 mg/dL (16/10/96 04:54:09 EST)    BUN: 11 mg/dL (81/19/14 78:29:56 EST)    Creatinine: 0.512 mg/dL Low (21/30/86 57:84:69 EST)    Afn Amer Glomerular Filtration Rate: >90 (09/04/17 07:00:00 EST)    Non-Afn Amer Glomerular Filtration Rate: >90 (09/04/17 07:00:00 EST)    Sodium Lvl: 143 mmol/L (09/04/17 07:00:00 EST)    Potassium Lvl: 3.9 mmol/L (09/04/17 07:00:00 EST)    Chloride: 108 mmol/L (09/04/17 07:00:00 EST)    CO2: 31 mmol/L (09/04/17 07:00:00 EST)    Anion Gap: 4 (09/04/17 07:00:00 EST)    Calcium Lvl: 8.3 mg/dL Low (62/95/28 41:32:44 EST)    WBC: 4.9 thous/mm3 (09/04/17 07:00:00 EST)    RBC: 2.91 Mil/mm3 Low (09/04/17 07:00:00 EST)    Hgb: 8.8 Gm/dL Low (10/02/70 53:66:44 EST)    Hgb: 8.8 Gm/dL Low (03/47/42 59:56:38 EST)    Hct: 26.6 % Low (09/04/17 07:00:00 EST)    Hct: 26.7 % Low (09/04/17 07:00:00 EST)    Platelet: 219 thous/mm3 (09/04/17 07:00:00 EST)    MCV: 91.8 fL (09/04/17 07:00:00 EST)    MCH: 30.2 pGm (09/04/17 07:00:00 EST)    MCHC: 33 Gm/dL (75/64/33 29:51:88 EST)     RDW-SD: 45.1 fL (09/04/17 07:00:00 EST)    MPV: 9.3 fL Low (09/04/17 07:00:00 EST)    NRBC Percent: 0 % (09/04/17 07:00:00 EST)    Absolute NRBC Count: 0 thous/mm3 (09/04/17 07:00:00 EST)    Discharge Diagnoses    Lower GI bleed    probable diverticular bleed    Discharge Medications    _Discharge_    flecainide 50 mg oral tablet, 50 mg= 1 tab(s), PO, BID    Lasix, 40 mg, PO, Daily    Lomotil, 1 tab(s), PO, QID, PRN    loratadine, 10 mg, PO, Daily, PRN    losartan 25 mg oral tablet, 25 mg= 1 tab(s), PO, HS    Metoprolol Succinate ER, 25 mg, PO, BID    Misc Medication, See Instructions, prebagin po daily    Xarelto, 20 mg, PO, qPM, start tonight, if bleeding recurrs pls. stop with  evening meal    Discharge Instructions    d/c home with follow    polyp biopsy report to followed by PCP /GI    Counseling    Face to Face    35 minutes face to face discharge    [1] IM Hospitalist Admission H & P; Laurice Record MD, [facility] 09/01/2017 00:00  EST    [2] Endoscopy Procedure Note; Elesa Hacker MD, Lannie Fields 09/02/2017 00:00 EST    [3] Endoscopy Procedure Note; Elesa Hacker MD, Lannie Fields 09/02/2017 00:00 EST    [4] NM Gastrointestinal Blood Loss Imaging; Claris Gladden MD, Reuel Boom 09/02/2017 11:12  EST    SIGNATURE LINE Electronically signed by Einar Gip MD, Romi Rathel on 09/04/2017 at  12:11:04 EST

## 2017-09-01 NOTE — H&P (Signed)
Chief Complaint    I started having rectal bleeding with clots    History of Present Illness    61 year old Caucasian woman with a medical history of atrial fibrillation,  hypertension, and OA, who presents to the ER complaining of rectal bleeding.  As per the patient she has been experiencing for the past 6 days moderate to  severe crampy like spasm in the pelvic cavity at the level of the sacral bone,  she has associated these complaints with a neuromuscular issue, and she has  been taking 2-3 ibuprofen pills every 6 to 8 hours. The afternoon of the  admission the patient is suddenly felt like if she were trying to pass a gas,  but she passed blood and clots, having several similar episodes in the next  couple of hours therefore she decided to come to the ER. She stated that she  had a colonoscopy at the age of 13, which only revealed diverticulosis. She  denies abdominal pain, fever, or chills.    Review of Systems    CONSTITUTIONAL: No weight loss, fever, chills, weakness or fatigue.    HEENT: Eyes: No visual loss, blurred vision, double vision or yellow sclerae.  Ears, Nose, Throat: No hearing loss, sneezing, congestion, runny nose or sore  throat.    SKIN: No rash or itching.    CARDIOVASCULAR: No chest pain, chest pressure or chest discomfort. No  palpitations or edema.    RESPIRATORY: No shortness of breath, cough or sputum.    GASTROINTESTINAL: No anorexia, nausea, vomiting or diarrhea. No abdominal pain  or blood.    GENITOURINARY: No Burning on urination.    NEUROLOGICAL: No headache, dizziness, syncope, paralysis, ataxia, numbness or  tingling in the extremities. No change in bowel or bladder control.    MUSCULOSKELETAL: No muscle, back pain, joint pain or stiffness.    HEMATOLOGIC: No anemia, bleeding or bruising.    LYMPHATICS: No enlarged nodes. No history of splenectomy.    PSYCHIATRIC: No history of depression or anxiety.    ENDOCRINOLOGIC: No reports of sweating, cold or heat intolerance. No  polyuria  or polydipsia.    ALLERGIES: No history of asthma, hives, eczema or rhinitis.    Code Status    Full code    Physical Exam    Vitals & Measurements    **T:** 97.5 F (Oral) **TMIN:** 97.5 F (Oral) **TMAX:** 98.0 F (Oral)  **HR:** 50 **RR:** 18 **BP:** 96/65 **SpO2:** 95% **O2 Method (L/min):** Room  air **WT:** 146.8 Kg    GENERAL: The patient is a well-developed, well-nourished female in no apparent  distress. She is alert and oriented x3.    HEENT: Head is normocephalic and atraumatic. Extraocular muscles are intact.    LUNGS: Clear to auscultation.    HEART: Regular rate and rhythm without murmur.    ABDOMEN: Soft, nontender, and nondistended. Positive bowel sounds. No  hepatosplenomegaly was noted.    NEUROLOGIC: grossly intact.    PSYCHIATRIC: normal affect, but denies suicidal    SKIN: No ulceration or induration present.    Impression and Plan    # Lower GI Bleeding    Caucasian female with a hx of AFIB who presents with bright red blood per  rectum    Pt hemodynamically stable, initial assessment is reassuring pt with no fever,  tachycardia or peritoneal signs (guarding, rigid abdomen, rebound tenderness,  pain out of proportion to the examination), and no history of persistent  vomiting    Hx of NSAIDs or  anticoagulants use    No Hx of chronic Etoh abuse    Adequate intravenous IV access obtained x 2    Admit to telemetry unit    Follow up Hb/Htc    Start IV PPI and general supportive measures IV fluids, and oxygen    GI consult appreciated, NPO after midnight except meds, start bowel prep and  colonoscopy in the morning    CMS Two-Midnight Rule    I certify that hospital inpatient services are reasonable and medically  necessary. They are appropriately provided as inpatient services in accordance  with the two midnight benchmark under 42CFR 412 3(e), or the services are  specified as inpatient only procedure under 42 CFR 419 22(n).        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    Social History    _Alcohol_    Current, Wine, 1-2 times per month    _Other_    coffee- half caf 2-3 cups a day; diet soda- occasionally    _Substance Abuse_ \- Denies Substance Abuse    Never    _Tobacco_ \- Denies Tobacco Use    Past, Cigarettes    Family History    Heart attack: Father.    Heart disease: Father and Sister.    High blood pressure: Father and Sister.    High cholesterol: Father.    Non-Hodgkin's lymphoma: Mother.    Percutaneous coronary intervention: Sister.    Allergies    atropine    ACE inhibitors (cough)    Medications    _Inpatient_    Ambien, 5 mg= 1 tab(s), PO, HS, PRN    flecainide, 50 mg= 1 tab(s), PO, BID    Lopressor, 25 mg= 1 tab(s), PO, BID    loratadine, 10 mg= 1 tab(s), PO, Daily, PRN    Losartan, 50 mg, PO, HS    morPHINE, 1 mg= 0.5 mL, IV Push, q4hr, PRN    Percocet 5/325 oral tablet, 1 tab(s), PO, q4hr, PRN    Sodium Chloride 0.45% 1,000 mL, 1000 mL, IV    Sodium Chloride 0.9% 50 mL + pantoprazole IV Additive 40 mg    Tylenol, 1000 mg= 2 tab(s), PO, q8hr    _Home_    flecainide 50 mg oral tablet, 50 mg= 1 tab(s), PO, BID    Lomotil, 1 tab(s), PO, QID, PRN    loratadine, 10 mg, PO, Daily, PRN    losartan 25 mg oral tablet, 50 mg= 2 tab(s), PO, BID    metoprolol tartrate 25 mg oral tablet, 25 mg= 1 tab(s), PO, BID    Misc Medication, See Instructions, prebagin po daily    Xarelto, 20 mg, PO, Daily    Diet    Clear Liquid Diet - Ordered    -- 09/01/17 21:34:00 EST, Room Service, Scheduled / PRN    Lab Results    Glucose Lvl: 100 mg/dL (16/10/96 04:54:09 EST)    BUN: 19 mg/dL (81/19/14 78:29:56 EST)     Creatinine: 0.653 mg/dL (21/30/86 57:84:69 EST)    Afn Denyse Dago  Glomerular Filtration Rate: >90 (09/01/17 18:49:00 EST)    Non-Afn Amer Glomerular Filtration Rate: >90 (09/01/17 18:49:00 EST)    Sodium Lvl: 140 mmol/L (09/01/17 18:49:00 EST)    Potassium Lvl: 3.6 mmol/L (09/01/17 18:49:00 EST)    Chloride: 103 mmol/L (09/01/17 18:49:00 EST)    CO2: 29 mmol/L (09/01/17 18:49:00 EST)    Anion Gap: 8 (09/01/17 18:49:00 EST)    Total Protein: 7.6 Gm/dL (32/44/01 02:72:53 EST)    Albumin Lvl: 3.6 Gm/dL (66/44/03 47:42:59 EST)    Calcium Lvl: 9.4 mg/dL (56/38/75 64:33:29 EST)    Bilirubin Total: 0.5 mg/dL (51/88/41 66:06:30 EST)    Alkaline Phosphatase: 128 Units/L High (09/01/17 18:49:00 EST)    AST: 23 Units/L (09/01/17 18:49:00 EST)    ALT: 27 Units/L (09/01/17 18:49:00 EST)    Lactic Acid Lvl: 0.9 mmol/L (09/01/17 20:04:00 EST)    WBC: 8.9 thous/mm3 (09/01/17 18:49:00 EST)    RBC: 4.59 Mil/mm3 (09/01/17 18:49:00 EST)    Hgb: 10.6 Gm/dL Low (16/01/09 32:35:57 EST)    Hct: 31.2 % Low (09/02/17 02:54:00 EST)    Platelet: 317 thous/mm3 (09/01/17 18:49:00 EST)    MCV: 89.5 fL (09/01/17 18:49:00 EST)    MCH: 30.3 pGm (09/01/17 18:49:00 EST)    MCHC: 33.8 Gm/dL (32/20/25 42:70:62 EST)    RDW-SD: 43.8 fL (09/01/17 18:49:00 EST)    MPV: 9.2 fL Low (09/01/17 18:49:00 EST)    Absolute Neutro Count: 5.97 thous/mm3 (09/01/17 18:49:00 EST)    Absolute Lymphs Count: 1.89 thous/mm3 (09/01/17 18:49:00 EST)    Absolute Mono Count: 0.6 thous/mm3 (09/01/17 18:49:00 EST)    Absolute Eos Count: 0.37 thous/mm3 (09/01/17 18:49:00 EST)    Absolute Baso Count: 0.06 thous/mm3 (09/01/17 18:49:00 EST)    Neutrophils: 67 % (09/01/17 18:49:00 EST)    Lymphocytes: 21.2 % (09/01/17 18:49:00 EST)    Monocytes: 6.7 % (09/01/17 18:49:00 EST)    Eosinophils: 4.1 % (09/01/17 18:49:00 EST)    Basophils: 0.7 % (09/01/17 18:49:00 EST)    Immature Granulocytes: 0.3 % (09/01/17 18:49:00 EST)    NRBC Percent: 0 % (09/01/17 18:49:00 EST)    Absolute NRBC Count: 0  thous/mm3 (09/01/17 18:49:00 EST)    ABORh: O POS (09/01/17 19:02:00 EST)    Antibody Screen: Negative (09/01/17 19:02:00 EST)    BBTTH: Done (09/01/17 19:02:00 EST)    PT: 12.3 sec High (09/01/17 18:49:00 EST)    INR: 1.2 (09/01/17 18:49:00 EST)    aPTT: 38 sec High (09/01/17 18:49:00 EST)    Blue Top Tube To Hold: DONE (09/01/17 18:49:00 EST)    Diagnostic Results        ------        SIGNATURE LINE Electronically signed by Laurice Record MD, Carmelo Reidel on  09/02/2017 at 05:08:52 EST

## 2017-09-01 NOTE — Progress Notes (Signed)
Subjective    no further episodes of bleeding, no abdominal pain    Review of Systems    no fever, no sob,    Objective    Vitals & Measurements    **T:** 97.8 F (Oral) **TMIN:** 97.5 F (Oral) **TMAX:** 98.0 F (Oral)  **HR:** 56 **RR:** 17 **BP:** 127/104 **SpO2:** 94% **O2 Rate:** 5 **O2 Method  (L/min):** Room air    Physical Exam    alert and oriented    heent atraumatic, normocephalic    heart s1s2    lungs b/l airentry    abd soft and nontender    Medications    _Inpatient_    Ambien, 5 mg= 1 tab(s), PO, HS, PRN    flecainide, 50 mg= 1 tab(s), PO, BID    Lopressor, 25 mg= 1 tab(s), PO, BID    loratadine, 10 mg= 1 tab(s), PO, Daily, PRN    Losartan, 25 mg= 1 tab(s), PO, HS    morPHINE, 1 mg= 0.5 mL, IV Push, q4hr, PRN    Percocet 5/325 oral tablet, 1 tab(s), PO, q4hr, PRN    Sodium Chloride 0.45% 1,000 mL, 1000 mL, IV    Sodium Chloride 0.9% 50 mL + pantoprazole IV Additive 40 mg    Tylenol, 650 mg= 2 tab(s), PO, q6hr    Zofran, 4 mg= 2 mL, IM, q8hr, PRN    Lab Results    Hgb: 9.7 Gm/dL Low (40/10/27 25:36:64 EST)    Hct: 28.4 % Low (09/03/17 11:50:00 EST)    Diagnostic Results    Impression and Plan    61 year old Caucasian woman with a medical history of atrial fibrillation,  hypertension, and OA, who presents to the ER complaining of rectal bleeding.  on xarelto at home        Rectal bleed    red cell scan normal    s/p colonoscopy    IMPRESSION:    1\. Cecal polyps, status post biopsy.    2\. Extensive diverticular disease, left greater than right.    3\. Probable diverticular bleed, but no active bleeding site identified and no    localization of bleeding was able to be identified.        RECOMMENDATIONS: Given the patient's need for anticoagulation given atrial    fibrillation, we will proceed with EGD. Likely she has had a self-limited    diverticular bleed. If there is active bleeding, she should have a repeat    tagged RBC scan or go straight for angio if the bleeding is significant. She     will stay on a clear liquid diet. I would hold Xarelto for 24 to 48 hours.        EGD        [1] Mild duodenitis, otherwise normal upper endoscopy.        RECOMMENDATIONS: Clear liquid diet. Resume Xarelto in 48 hours if no further    bleeding. Tagged RBC scan versus angio for significant recurrent bleeding.    [2]        xarelto on hold    H/H q12 hrs    scd for dvt prophylaxis    full code    [1] Endoscopy Procedure Note; Elesa Hacker MD, Emma [name] 09/02/2017 00:00 EST    [2] Endoscopy Procedure Note; Elesa Hacker MD, Emma [name] 09/02/2017 00:00 EST    SIGNATURE LINE Electronically signed by Einar Gip MD, Emma [name] on 09/03/2017 at  12:42:47 EST

## 2017-09-01 NOTE — Procedures (Signed)
__________________________________________________________________________    ENDOSCOPY PROCEDURE NOTE  DATE:  09/02/2017    PROCEDURES:  1.  Colonoscopy.  2.  Upper endoscopy.    INDICATIONS:  This is a 61 year old woman with a history of atrial fibrillation  on Xarelto who had a colonoscopy in [place] seven years ago showing only  diverticulosis undergoing colonoscopy after tagged RBC scan did not localize  source of bleeding.  The patient has been passing maroon colored blood.    ENDOSCOPIST:  Rebbeca Paul, M.D.    MEDICATIONS:  Propofol 550.    INSTRUMENT:  Olympus pediatric colonoscope.    DESCRIPTION OF PROCEDURE:  After informed consent was obtained, the patient was  placed in the left lateral decubitus position.  Perianal inspection showed no  visible hemorrhoids.  Digital exam revealed no palpable masses.  Olympus  pediatric colonoscope was gently inserted into the rectum and advanced through  extensive diverticular disease involving the sigmoid and descending colon with  luminal distortion.  Maroon blood was seen, but with vigorous washing I did not  see bleeding or a clot coming from the diverticulum.  The transverse colon was  notable for a little bit of liquid maroon blood with mild to moderate  diverticular disease, but again with no active bleeding site identified.  The  right colon was also notable for mild diverticulosis.  The cecum was reached and  clearly identified by the IC valve and the appendiceal orifice was a  hyperpigmented inflammatory-appearing polyp.  Please see pictures.  In light of  the GI bleeding, a polypectomy was intentionally now performed and biopsies were  obtained.  Ileoscopy showed normal terminal ileum.  Upon withdrawal, there was  again extensive diverticular disease, left greater than right, with no  diverticulum with active bleeding identified.    IMPRESSION:    1.  Cecal polyps, status post biopsy.    2.  Extensive diverticular disease, left greater than  right.  3.  Probable diverticular bleed, but no active bleeding site identified and no  localization of bleeding was able to be identified.    RECOMMENDATIONS:  Given the patient's need for anticoagulation given atrial  fibrillation, we will proceed with EGD.  Likely she has had a self-limited  diverticular bleed.  If there is active bleeding, she should have a repeat  tagged RBC scan or go straight for angio if the bleeding is significant.  She  will stay on a clear liquid diet.  I would hold Xarelto for 24 to 48 hours.        POCEDURE:  ESOPHAGOGASTRODUODENOSCOPY     INDICATIONS:  A 61 year old woman with gastrointestinal bleeding and anemia with  colonoscopy showing diverticulosis.  Given need for anticoagulation and no  definite bleeding site identified, proceed with esophagogastroduodenoscopy.      MEDICATIONS:  Propofol 550.    INSTRUMENT:  Olympus diagnostic upper endoscope.    DESCRIPTION OF PROCEDURE:  After informed consent was obtained, the patient was  placed in the left lateral position.  Diagnostic endoscope was used to intubate  the esophagus under direct visualization.  The upper esophagus body and GE  junction were normal.  The scope was then advanced into the stomach.  On forward  view, the antrum, body and retroflexion view of the cardia and fundus was  normal.  The duodenal bulb distal bulb was notable for mild duodenitis.  No  ulcer was seen.  There was bile in the second portion of the duodenum.  Air was  suctioned.  The  scope was withdrawn.    IMPRESSION:  Mild duodenitis, otherwise normal upper endoscopy.    RECOMMENDATIONS:  Clear liquid diet.  Resume Xarelto in 48 hours if no further  bleeding.  Tagged RBC scan versus angio for significant recurrent bleeding.    Dictated by:  Rebbeca Paul, M.D.    D:  09/02/2017 17:24:33  T:  09/03/2017 06:53:05  E:  09/03/2017 06:53:05  GA/wmt  Job# [id number]   SIGNATURE LINE    Electronically signed by Elesa Hacker MD, Lannie Fields on 09/03/2017 at 16:42:18  EST

## 2017-09-02 ENCOUNTER — Ambulatory Visit

## 2017-09-02 LAB — HX MAGNESIUM LEVEL
CASE NUMBER: 2018337000332
HX MAGNESIUM LVL: 2.1 mg/dL — NL (ref 1.8–2.5)

## 2017-09-02 LAB — HX CBC W/ INDICES
CASE NUMBER: 2018337000173
HX ABSOLUTE NRBC COUNT: 0 10*3/uL
HX HCT: 32.6 % — ABNORMAL LOW (ref 36.0–47.0)
HX HGB: 11.1 g/dL — ABNORMAL LOW (ref 11.8–16.0)
HX MCH: 30.3 pg — NL (ref 26.0–34.0)
HX MCHC: 34 g/dL — NL (ref 31.0–37.0)
HX MCV: 89.1 fL — NL (ref 80.0–100.0)
HX MPV: 8.7 fL — ABNORMAL LOW (ref 9.4–12.4)
HX NRBC PERCENT: 0 % — NL
HX PLATELET: 244 10*3/uL — NL (ref 150.0–400.0)
HX RBC: 3.66 10*6/uL — ABNORMAL LOW (ref 3.9–5.2)
HX RDW-CV: 13.2 % — NL (ref 11.5–14.5)
HX RDW-SD: 43.2 fL — NL (ref 35.0–51.0)
HX WBC: 6.8 10*3/uL — NL (ref 3.7–11.2)

## 2017-09-02 LAB — HX LACTIC ACID
CASE NUMBER: 2018337000332
HX LACTIC ACID LVL: 0.8 mmol/L — NL (ref 0.4–2.0)

## 2017-09-02 LAB — HX BASIC METABOLIC PANEL
CASE NUMBER: 2018337000332
HX ANION GAP: 8 — NL (ref 3.0–11.0)
HX BUN: 18 mg/dL — NL (ref 8.0–23.0)
HX CALCIUM LVL: 8.5 mg/dL — NL (ref 8.5–10.5)
HX CHLORIDE: 108 mmol/L — NL (ref 98.0–110.0)
HX CO2: 28 mmol/L — NL (ref 21.0–32.0)
HX CREATININE: 0.462 mg/dL — ABNORMAL LOW (ref 0.55–1.3)
HX GLUCOSE LVL: 96 mg/dL — NL (ref 70.0–110.0)
HX POTASSIUM LVL: 3.6 mmol/L — NL (ref 3.6–5.2)
HX SODIUM LVL: 144 mmol/L — NL (ref 136.0–146.0)

## 2017-09-02 LAB — HX HEPATIC FUNCTION PANEL
CASE NUMBER: 2018337000332
HX ALBUMIN LVL: 2.8 g/dL — ABNORMAL LOW (ref 3.2–5.0)
HX ALKALINE PHOSPHATASE: 94 U/L — NL (ref 30.0–117.0)
HX ALT: 20 U/L — NL (ref 6.0–55.0)
HX AST: 17 U/L — NL (ref 6.0–40.0)
HX BILIRUBIN DIRECT: 0.2 mg/dL — NL (ref 0.0–0.3)
HX BILIRUBIN TOTAL: 0.6 mg/dL — NL (ref 0.2–1.2)
HX TOTAL PROTEIN: 5.5 g/dL — ABNORMAL LOW (ref 6.0–8.4)

## 2017-09-02 LAB — HX GLOMERULAR FILTRATION RATE (ESTIMATED)
CASE NUMBER: 2018337000332
HX AFN AMER GLOMERULAR FILTRATION RATE: >90
HX NON-AFN AMER GLOMERULAR FILTRATION RATE: >90

## 2017-09-02 LAB — HX PHOSPHORUS LEVEL
CASE NUMBER: 2018337000332
HX PHOSPHORUS: 3.3 mg/dL — NL (ref 2.4–4.9)

## 2017-09-02 LAB — HX HEMOGLOBIN AND HEMATOCRIT
CASE NUMBER: 2018337000231
CASE NUMBER: 2018337000175
CASE NUMBER: 2018337000176
HX HCT: 31.2 % — ABNORMAL LOW (ref 36.0–47.0)
HX HCT: 30.5 % — ABNORMAL LOW (ref 36.0–47.0)
HX HCT: 29.9 % — ABNORMAL LOW (ref 36.0–47.0)
HX HGB: 10.6 g/dL — ABNORMAL LOW (ref 11.8–16.0)
HX HGB: 10.1 g/dL — ABNORMAL LOW (ref 11.8–16.0)
HX HGB: 10.1 g/dL — ABNORMAL LOW (ref 11.8–16.0)

## 2017-09-03 LAB — HX HEMOGLOBIN AND HEMATOCRIT
CASE NUMBER: 2018338000005
CASE NUMBER: 2018338000006
CASE NUMBER: 2018338000014
CASE NUMBER: 2018338000017
HX HCT: 26.4 % — ABNORMAL LOW (ref 36.0–47.0)
HX HCT: 28.4 % — ABNORMAL LOW (ref 36.0–47.0)
HX HCT: 28.5 % — ABNORMAL LOW (ref 36.0–47.0)
HX HCT: 29.7 % — ABNORMAL LOW (ref 36.0–47.0)
HX HGB: 9 g/dL — ABNORMAL LOW (ref 11.8–16.0)
HX HGB: 9.6 g/dL — ABNORMAL LOW (ref 11.8–16.0)
HX HGB: 9.7 g/dL — ABNORMAL LOW (ref 11.8–16.0)
HX HGB: 9.9 g/dL — ABNORMAL LOW (ref 11.8–16.0)

## 2017-09-03 LAB — HX SST GOLD TUBE TO HOLD: CASE NUMBER: 2018338000006

## 2017-09-04 ENCOUNTER — Ambulatory Visit: Admitting: Internal Medicine

## 2017-09-04 LAB — HX CBC W/ INDICES
CASE NUMBER: 2018339000308
HX ABSOLUTE NRBC COUNT: 0 10*3/uL
HX HCT: 26.7 % — ABNORMAL LOW (ref 36.0–47.0)
HX HGB: 8.8 g/dL — ABNORMAL LOW (ref 11.8–16.0)
HX MCH: 30.2 pg — NL (ref 26.0–34.0)
HX MCHC: 33 g/dL — NL (ref 31.0–37.0)
HX MCV: 91.8 fL — NL (ref 80.0–100.0)
HX MPV: 9.3 fL — ABNORMAL LOW (ref 9.4–12.4)
HX NRBC PERCENT: 0 % — NL
HX PLATELET: 219 10*3/uL — NL (ref 150.0–400.0)
HX RBC: 2.91 10*6/uL — ABNORMAL LOW (ref 3.9–5.2)
HX RDW-CV: 13.4 % — NL (ref 11.5–14.5)
HX RDW-SD: 45.1 fL — NL (ref 35.0–51.0)
HX WBC: 4.9 10*3/uL — NL (ref 3.7–11.2)

## 2017-09-04 LAB — HX HEMOGLOBIN AND HEMATOCRIT
CASE NUMBER: 2018339000005
CASE NUMBER: 2018339000007
HX HCT: 26.6 % — ABNORMAL LOW (ref 36.0–47.0)
HX HCT: 27.8 % — ABNORMAL LOW (ref 36.0–47.0)
HX HGB: 8.8 g/dL — ABNORMAL LOW (ref 11.8–16.0)
HX HGB: 9.1 g/dL — ABNORMAL LOW (ref 11.8–16.0)

## 2017-09-04 LAB — HX GLOMERULAR FILTRATION RATE (ESTIMATED)
CASE NUMBER: 2018339000308
HX AFN AMER GLOMERULAR FILTRATION RATE: >90
HX NON-AFN AMER GLOMERULAR FILTRATION RATE: >90

## 2017-09-04 LAB — HX BASIC METABOLIC PANEL
CASE NUMBER: 2018339000308
HX ANION GAP: 4 — NL (ref 3.0–11.0)
HX BUN: 11 mg/dL — NL (ref 8.0–23.0)
HX CALCIUM LVL: 8.3 mg/dL — ABNORMAL LOW (ref 8.5–10.5)
HX CHLORIDE: 108 mmol/L — NL (ref 98.0–110.0)
HX CO2: 31 mmol/L — NL (ref 21.0–32.0)
HX CREATININE: 0.512 mg/dL — ABNORMAL LOW (ref 0.55–1.3)
HX GLUCOSE LVL: 94 mg/dL — NL (ref 70.0–110.0)
HX POTASSIUM LVL: 3.9 mmol/L — NL (ref 3.6–5.2)
HX SODIUM LVL: 143 mmol/L — NL (ref 136.0–146.0)

## 2017-09-04 LAB — HX SURG PATH FINAL REPORT
CASE NUMBER: 0
HX NOTE: 88305

## 2017-09-04 NOTE — Progress Notes (Signed)
* * *        **  Emma [name]**    ------    41 Y old Female, DOB: 18-May-1956    [address] [place], [place], [place] [address]    Home: [phone]    Provider: Mardene Sayer        * * *    Web Encounter    ---    Answered by    Chinita Pester    Date: 09/04/2017        Time: 10:48 AM    Reason    Transition of Care    ------            Message                      Patient being discharged from [facility] today, 09/04/17. In for GI bleed, they are scheduled with Tarina for tomorrow 09/05/17 at 10AM.                Action Taken                      Geoffroy,Jenn  09/04/2017 4:25:27 PM > Spoke to pt.Marland KitchenMarland KitchenPt is doing well. happy to be home. Pt just very tired. No medication changes, pt will be restarting her Xarelto tomorrow. Medications updated. See VV. TM-FYI      Mansur,Tarina  09/05/2017 11:40:17 AM >                     * * *              * * *        ---        Reason for Appointment    ---      1\. Transition of Care    ---      Current Medications    ---    Taking      * flecainide 100 mg tablet 1 tab(s) orally BID     ---    * Tylenol Extra Strength 500 mg tablet 2 tab(s) orally every 6 hours prn     ---    * Xarelto 20 mg tablet 1 tab(s) orally once a day (in the evening)     ---    * Metoprolol Tartrate 25 mg tablet 1 tab(s) orally Twice a day     ---    * Lasix 40 mg tablet 1 tab(s) orally once a day     ---    * loperamide-simethicone 2 mg-125 mg tablet 1 tab(s) orally Daily as needed for diarrhea     ---    * loratadine 10 mg tablet 1 tab(s) orally once a day     ---    * losartan 25 mg tablet 1 tab(s) orally once a day     ---    * Medication List reviewed and reconciled with the patient     ---          * * *          Patient: Emma [name], Emma [name] DOB: November 26, 1955 Provider: Mardene Sayer  09/04/2017    ---    Note generated by eClinicalWorks EMR/PM Software ([email or web address])

## 2017-09-05 ENCOUNTER — Ambulatory Visit: Admitting: Medical

## 2017-09-05 LAB — HX .AUTOMATED DIFF
CASE NUMBER: 2018340001292
HX ABSOLUTE BASO COUNT: 0.07 10*3/uL — NL (ref 0.0–0.22)
HX ABSOLUTE EOS COUNT: 0.37 10*3/uL — NL (ref 0.0–0.45)
HX ABSOLUTE LYMPHS COUNT: 1.7 10*3/uL — NL (ref 0.74–5.04)
HX ABSOLUTE MONO COUNT: 0.48 10*3/uL — NL (ref 0.0–1.34)
HX ABSOLUTE NEUTRO COUNT: 3.89 10*3/uL — NL (ref 1.48–7.95)
HX BASOPHILS: 1.1 %
HX EOSINOPHILS: 5.7 %
HX IMMATURE GRANULOCYTES: 0.5 % — NL (ref 0.0–2.0)
HX LYMPHOCYTES: 26 %
HX MONOCYTES: 7.3 %
HX NEUTROPHILS: 59.4 %

## 2017-09-05 LAB — HX COMPREHENSIVE METABOLIC PANEL
CASE NUMBER: 2018340001292
HX ALBUMIN LVL: 3.3 g/dL — NL (ref 3.2–5.0)
HX ALKALINE PHOSPHATASE: 94 U/L — NL (ref 30.0–117.0)
HX ALT: 23 U/L — NL (ref 6.0–55.0)
HX ANION GAP: 8 — NL (ref 3.0–11.0)
HX AST: 20 U/L — NL (ref 6.0–40.0)
HX BILIRUBIN TOTAL: 0.3 mg/dL — NL (ref 0.2–1.2)
HX BUN: 12 mg/dL — NL (ref 8.0–23.0)
HX CALCIUM LVL: 9.3 mg/dL — NL (ref 8.5–10.5)
HX CHLORIDE: 108 mmol/L — NL (ref 98.0–110.0)
HX CO2: 28 mmol/L — NL (ref 21.0–32.0)
HX CREATININE: 0.619 mg/dL — NL (ref 0.55–1.3)
HX GLUCOSE LVL: 104 mg/dL — NL (ref 70.0–110.0)
HX POTASSIUM LVL: 3.9 mmol/L — NL (ref 3.6–5.2)
HX SODIUM LVL: 144 mmol/L — NL (ref 136.0–146.0)
HX TOTAL PROTEIN: 6.5 g/dL — NL (ref 6.0–8.4)

## 2017-09-05 LAB — HX CBC W/ DIFF
CASE NUMBER: 2018340001292
HX ABSOLUTE NRBC COUNT: 0 10*3/uL
HX HCT: 29.2 % — ABNORMAL LOW (ref 36.0–47.0)
HX HGB: 10 g/dL — ABNORMAL LOW (ref 11.8–16.0)
HX MCH: 30.6 pg — NL (ref 26.0–34.0)
HX MCHC: 34.2 g/dL — NL (ref 31.0–37.0)
HX MCV: 89.3 fL — NL (ref 80.0–100.0)
HX MPV: 9 fL — ABNORMAL LOW (ref 9.4–12.4)
HX NRBC PERCENT: 0 % — NL
HX PLATELET: 306 10*3/uL — NL (ref 150.0–400.0)
HX RBC: 3.27 10*6/uL — ABNORMAL LOW (ref 3.9–5.2)
HX RDW-CV: 13.3 % — NL (ref 11.5–14.5)
HX RDW-SD: 43.7 fL — NL (ref 35.0–51.0)
HX WBC: 6.5 10*3/uL — NL (ref 3.7–11.2)

## 2017-09-05 LAB — HX GLOMERULAR FILTRATION RATE (ESTIMATED)
CASE NUMBER: 2018340001292
HX AFN AMER GLOMERULAR FILTRATION RATE: >90
HX NON-AFN AMER GLOMERULAR FILTRATION RATE: >90

## 2017-09-05 NOTE — Progress Notes (Signed)
* * *        Emma [name]**    ------    34 Y old Female, DOB: 1956/02/24    Account Number: [id number]    [address], [place], [id number]    Home: [phone]    Guarantor: Emma [name] Insurance: Bc/bs [employer] Blue Payer ID: [id number]    PCP: Mardene Sayer    Appointment Facility: [facility] General Office        * [phone]    09/05/2017    **Appointment Provider:** Consuelo Pandy, PA    ------      **Supervising Provider:** Mardene Sayer, M.D.    ---        **Current Medications**    ---    Taking      * flecainide 100 mg tablet 1 tab(s) orally BID     ---    * Tylenol Extra Strength 500 mg tablet 2 tab(s) orally every 6 hours prn     ---    * Xarelto 20 mg tablet 1 tab(s) orally once a day (in the evening)     ---    * Metoprolol Tartrate 25 mg tablet 1 tab(s) orally Twice a day     ---    * Lasix 40 mg tablet 1 tab(s) orally once a day     ---    * loperamide-simethicone 2 mg-125 mg tablet 1 tab(s) orally Daily as needed for diarrhea     ---    * loratadine 10 mg tablet 1 tab(s) orally once a day     ---    * losartan 25 mg tablet 1 tab(s) orally once a day     ---    * Medication List reviewed and reconciled with the patient     ---      Past Medical History    ---      Paroxysmal Atrial Firbrilation Dr Audley Hose .        ---    HTN .        ---    Obesity .        ---    O/A .        ---    hx of depression she was on Zoloft.        ---    Spinal Stenosis .        ---    2011 colonoscopy at age 77 normal . repeat at age 65.        ---    A. Fib. admit 08/2016.        ---    12/2016: A fib admit to [facility] for Ablation, complications, post ablatipon  Pericarditis. - Unsuccessful.        ---    12/2016: Cardiac Cath False POS stress test, normal Coronary arteries,  Diastolic HF w/ elevated heart filling pressures. normal LV systolic function.  Marland Kitchen        ---    08/2017: Admit [facility] GI bleed , On Xarelto ( in setting of additinal NSAIDs ) .        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 Marland Kitchen        ---    08/2017: EGD normal.        ---    08/2017: NM red Cell tracer, GI blood loss Imaging. - Normal. .        ---      **  Surgical History**    ---      C section    ---    Rhinoplasty 1875    ---    [facility]ota left knee replacement 05/2011    ---    GI bleed 08/2017    ---      **Family History**    ---      Father: deceased 51 yrs, MI x2, diagnosed with Heart Disease    ---    Mother: deceased 19 yrs, Hodgkins Lymphoma, Sarcoma, s/p cholecystectomy,  shingles    ---    Daughter 1: alive 24 yrs, [place]    ---    Daughter 2: alive 22 yrs, [place]    ---    Brother 1: alive, Chronic Schizophreni c, Mental Illness    ---    Brother 2: alive, HTN, Hypertension    ---    Sister 1: alive 72 yrs, HTN , CAD, Hypertension, Heart Disease    ---    Sister 2: alive 31 yrs, SLE , HTN, Hypertension    ---    2 brother(s) , 2 sister(s) - healthy. 2daughter(s) - healthy.    ---      **Social History**    ---    Tobacco Use  Are you a? Former smoker quit at the age of 37  , Patient was  counseled on the dangers of tobacco use: 09/05/2017.    Alcohol 1 per week.    Drug use: yes, Past use: marrijauana.Marland KitchenMarland KitchenPCP a couple times as a teen .    Marital Status: Married.    Children: two children.    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    Exercise: none....except physical therapy.    Sexually active: yes .    MOLST Form  Does patient have completed form? No  , MOLST Form discussed on:  11/19/2013.    HCP  Does patient have a current proxy? No  , HCP was discussed: 11/19/2013.    COPD Population Screener Total Score: 0.      **Allergies**    ---      Ace inhibitors: cough - Allergy    ---      **Hospitalization/Major Diagnostic Procedure**    ---      [facility] for A fib, Unsuccessful. 12/2016    ---    [facility] 06/2017    ---        **Reason for Appointment**    ---      1\. Hospital follow up,  discharged 12/05    ---      **History of Present Illness**    ---    _Interim History_ :    61 yr old female, PMHX a fib, HTN, and OA, on Xaretlo here for Hopsital f/u.  Admitted to [facility] 12/2-12/5/18 w/ GI bleed after taking Ibuprofen for  musculo/skeletal pain. Xarelto was held while in the hopsital and just  restarted last night at 1 am. Had episode of bright red blood again this  morning. Having terrible gas pains and diarrhea w/ rectal pain. She tried  Tylenol and Simethicone last night. She is not having pain now.    While in the hopsital:    EGD- Mild Duodenitis, otherwise normal.    Colo- Cecal Polyp-Sessile Serrated Adenoma, Divertiuclar disease and probable  diverticular bleed.    NM GI bleed Tagged RBC scan did not localize source of bleeding.    Labs: h/h 13.9/41.1 on admission  and h/h 8.8/26.7 on d/c.      **Vital Signs**    ---    Wt 326.2, Ht 65, BP 116/80, Supine:116/76, Repeat:116/76, **Standing:116/76**  , BMI  54.28  , Temp 98.4, HR 62, O2 Sat 97%RA.      **Examination**    ---    _General Examination_ :    General Appearance:  NAD  ,  alert and oriented  .    Oral cavity:  clear  ,  moist mucus membranes  .    HEENT:  Head - NC/AT  ,  PERRLA  ,  EOMI  , eyes: anicteric.  TM's clear and  flat bilaterally  ,  Oropharynx clear with MMM  .    Neck, Thyroid :  supple  ,  no thyromegaly  ,  no lymphadenopathy  .    Heart:  RRR  ,  no murmurs  .    Lungs:  clear to auscultation bilaterally  ,  no wheezes/rhonchi/rales  .    Abdomen:  soft, NT/ND, BS present  .    Extremities:  no clubbing, no edema  .    Neurologic Exam:  non-focal exam  .          **Assessments**    ---    1\. Gastrointestinal hemorrhage, unspecified gastrointestinal hemorrhage type  - K92.2 (Primary)    ---    2\. History of GI bleed - Z87.19    ---    3\. Essential hypertension - I10    ---    4\. Chronic atrial fibrillation - I48.2    ---      **Treatment**    ---      **1\. Gastrointestinal hemorrhage, unspecified  gastrointestinal hemorrhage  type**    Notes: pt appears stable, afebrile and not orthostatic. bleeding was thought  to be due to bleeding diverticulum, but no source identified.    STAT CBC/ CMP. If stable, then hold Xarelto again... and get appt w/ GI.    If H/H lower then will refer to Er.    And if recurrent bleeding today, then will present to ER.    Pt will return to the office after her labs to await results/ plan.        Hopsital notes, labs, radiology rev'd. Discussed case and care w/ Dr Ruffin Frederick.    husband: [phone] Sharma Covert.            Addendum: H/H improved Hgb 10. Dr Ruffin Frederick spoke to Dr Kelli Hope. she will hold  Xarelto until no further bleeding. He will see her in the office in 7-10 days.  still suspects bleeding diverticulum as source. pt aware. knows to call asap  prn increased sxs or concerns, or persistent heavy bleeding.    ---        **2\. History of GI bleed**    _LAB: CBC w/ Differential_    _LAB: Comprehensive Metabolic Panel_    Notes: see above.        **3\. Essential hypertension**    Notes:  BP stable, and at goal. no orthostatics.  .        **4\. Chronic atrial fibrillation**    Notes:  heart rate is regular and controlled in the office today. She ideally  needs anticoag w/ Xarelto, but must keep on hold for now due to GI bleeding.  .        **5\. Others**    Continue flecainide tablet, 100 mg, 1 tab(s), orally, BID  Continue Tylenol Extra Strength tablet, 500 mg, 2 tab(s), orally, every 6  hours prn    Continue Xarelto tablet, 20 mg, 1 tab(s), orally, once a day (in the evening)-  ON HOLD    Continue Metoprolol Tartrate tablet, 25 mg, 1 tab(s), orally, Twice a day    Continue Lasix tablet, 40 mg, 1 tab(s), orally, once a day    Continue loperamide-simethicone tablet, 2 mg-125 mg, 1 tab(s), orally, Daily  as needed for diarrhea    Continue loratadine tablet, 10 mg, 1 tab(s), orally, once a day    Continue losartan tablet, 25 mg, 1 tab(s), orally, once a day    Notes: Atrial  fibrillation or flutter material was published to portal.      **Procedure Codes**    ---      [phone] TCM (within 7 days) high complexity    ---      **Follow Up**    ---    after labs and then as scheduled and prn.    **Appointment Provider:** Consuelo Pandy, PA    Electronically signed by Mardene Sayer , M.D. on 09/07/2017 at 03:11 PM EST    Sign off status: Completed        * * *        [facility]    [address]    3rd Floor, [address]    [place], [place] [address]    Tel: [phone]    Fax: [phone]              * * *          Patient: Emma [name], Emma [name] DOB: 30-Apr-1956 Progress Note: Consuelo Pandy, PA  09/05/2017    ---    Note generated by eClinicalWorks EMR/PM Software ([email or web address])

## 2017-09-06 ENCOUNTER — Ambulatory Visit: Admitting: Internal Medicine

## 2017-09-13 LAB — HX STOOL CARDS: HX RESULT: NEGATIVE

## 2017-10-07 ENCOUNTER — Ambulatory Visit: Admitting: Internal Medicine

## 2017-10-07 NOTE — Progress Notes (Signed)
* * *        **  Emma [name]**    ------    40 Y old Female, DOB: 09/21/1956    [address], [place], [place], [place] [address]    Home: [phone]    Provider: Mardene Sayer        * * *    Telephone Encounter    ---    Answered by    Rae Halsted    Date: 10/07/2017        Time: 11:56 AM    Reason    refill request    ------            Message                      refill request for Metoprolol                 Action Taken                      Rae Halsted  10/07/2017 11:57:15 AM > faxed                Refills    Refill Metoprolol Tartrate tablet, 25 mg, orally, 180, 1 tab(s),  Twice a day, 90 days, Refills=4    ------          * * *                ---          * * *          Patient: Emma [name] DOB: 1956-04-21 Provider: Mardene Sayer  10/07/2017    ---    Note generated by eClinicalWorks EMR/PM Software ([email or web address])

## 2017-10-18 ENCOUNTER — Ambulatory Visit: Admitting: Internal Medicine

## 2017-10-18 NOTE — Progress Notes (Signed)
* * *        **  Emma [name]**    ------    33 Y old Female, DOB: 06-01-1956    [address], [place], [place], [place] [address]    Home: [phone]    Provider: Mardene Sayer        * * *    Telephone Encounter    ---    Answered by    Carlisle Beers    Date: 10/18/2017        Time: 01:19 PM    Caller    Vikki Ports    ------            Reason    pelvic US            Message                      LM on Nonurgent line.  Has order for pelvic US.  Tried to book herself. Would like a call back                Action Taken                      Carlisle Beers , RN 10/18/2017 1:35:58 PM >       Clements,Maria  10/18/2017 1:49:09 PM > 10/22/17 at 1pm [facility]; patient aware                    * * *                ---          * * *          Patient: Emma [name] DOB: 1956/07/25 Provider: Mardene Sayer  10/18/2017    ---    Note generated by eClinicalWorks EMR/PM Software ([email or web address])

## 2017-11-07 ENCOUNTER — Ambulatory Visit (HOSPITAL_BASED_OUTPATIENT_CLINIC_OR_DEPARTMENT_OTHER): Admitting: Psychiatry

## 2017-12-10 ENCOUNTER — Ambulatory Visit: Admitting: Cardiovascular Disease

## 2017-12-11 ENCOUNTER — Ambulatory Visit: Admitting: Nurse Practitioner

## 2017-12-11 ENCOUNTER — Ambulatory Visit (HOSPITAL_BASED_OUTPATIENT_CLINIC_OR_DEPARTMENT_OTHER)

## 2017-12-11 NOTE — Progress Notes (Signed)
* * *         [facility]**        ---    Lawernce Ion. Sindy Messing, MD [facility] Kieth Brightly. Donnetta Simpers, MD [facility] Barbera Setters Byrd Hesselbach, MD  [facility];    Darlyn Chamber, MD [facility] Roosvelt Maser, MD [facility] Shanon Brow, MD [facility]  Nile Dear. Audley Hose, MD [facility]    Kandis Mannan A. Karie Mainland, MD [facility] Johnell Comings. MacNaught, MD [facility] Mitchel Honour, MD Erma Pinto, MD [facility]    Colletta Maryland, MD Weston Anna, MD [facility] Kerby Moors, NP Maximino Greenland ,  NP    Tonna Corner, NP Danelle Earthly, NP        * * *     **Patient Name:** Emma [name]   **Date:** 12/11/2017    --- ---     **DOB:** [birth year]     **Referring Provider:** Mardene Sayer   **Appointment Provider:** Tonna Corner, NP        * * *    12/11/2017  Progress Notes: Tonna Corner, NP    --- ---    ---        Current Medications    ---    Taking     * furosemide 40 mg tablet 1 tab(s) orally B.I.D.    ---    * Metoprolol Tartrate 25 mg tablet 1 tab(s) orally B.I.D.    ---    * loratadine 10 mg tablet 1 tab(s) orally once a day    ---    * Tylenol Extra Strength 500 mg tablet 2 tab(s) orally PRN    ---    * flecainide 50 mg tablet 1 tab(s) orally B.I.D.    ---    * Medication List reviewed and reconciled with the patient    ---      Past Medical History    ---      PAF - PVI procedure at [facility] 01/18/17 apparently unsuccessful, flecainide  doubled to 100 bid - postop complicated by pericarditis on colchicine    ---    HTN    ---    Obesity    ---    Osteoarthritis    ---    Spinal stenosis    ---    Fasle positive stress test with normal coronaries on cath 12/2016    ---      Surgical History    ---      C section    ---    rhinoplasty    ---    knee replacement    ---      Family History    ---      Father: deceased 45 yrs, CAD, MI, AAA    ---    Mother: deceased 42 yrs, Died of CA, initially lymphoma then sarcomas in leg  with mets    ---    Siblings: sister- HTN, CAD    ---      Social History    ---    Tobacco Discontinued: 1980s, Status: former smoker,  How long has it been since  you last smoked? > 10 years, Patient counseled on the dangers of tobacco use:  12/11/2017.    Advised to lose weight: Yes Discussed elevated BMI with the patient and  importance of losing weight for cardiovascular and general overall health,  Discussed elevated BMI with the patient and importance of losing weight for  cardiovascular and general overall health.    No Have  you fallen in the past year?.    no ETOH.    no Recreational drug use.    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    ---      Allergies    ---      ace inhibitors    ---      Review of Systems    ---     _._ :    no Change in Vision. no Major change in weight. no Nausea. no vomitting. no  Diarrhea. no hematemesis. no Blood in urine. no blood in stool. no black,  tarry stool. no focal weakness, numbness or tingling. no rashes. no  hallucinations. no fevers, chills. no decrease in appetite. no Intrascapular  pain. no Bruising. no abdominal pain.            Reason for Appointment    ---      1\. Follow up; pAF, HTN, Diastolic heart failure    ---      History of Present Illness    ---     _History of Present Illness_ :    62 year old with a history of PAF, hypertension, and obesity, who presents for  followup. She was last seen in our office in September, 2018. AT that time she  was stable from cardiac perspectives. Since her last office visit, she reports  being admitted in December, 2018 for diverticular bleed. GI recommended  holding Xarelto. Unfortunately, patient was unable to follow up in our office,  she is in the process of moving.    She states overall, she feels well. She missed a dose of Metoprolol the other  week and had a brief episode of AFib and palpitations. She took an additional  tablet and symptoms resolved. Formulation of her Metoprolol was changed from  succinate to tartrate, unclear reasoning why as she was stable on Toprol XL 25  mg BID. She denies and recurrent bleeding. No  palpitations since recent  episode. No chest pain, pressure, SOB, DOE, dizziness, near syncope.      Vital Signs    ---    HR 57, BP 142/80, Wt 325, Ht 65, BMI 54.08, Repeat BP 140/88, Med Assist: rm.      Examination    ---     _HS_ :    General: pleasant, obese, middle aged female, in no distress. ENMT EOMI,  anicteric, MMM, OP clear. Neck: no JVD, bruits, or lymphadenopathy. Pulmonary  System clear to auscultation bilaterally, no wheezes or crackles. Cardiac: PMI  is nondisplaced, normal S1 S2, regular rate, no murmur, gallop or pericardial  rub. GI SYSTEM obese, soft, NT/ND, BS present, no guarding or rigidity.  Extremities no clubbing, no edema, + lymphedema. Peripheral pulses: normal  (2+) bilaterally. Skin: normal, no rash. Neurologic exam: grossly non-focal.  Pyschiatric appropriate.          Data    ---     _EKG (reviewed personally)_ :    06/10/17: sinus rhythm, 50, borderline IVCD, QTC .463.    _Lipids_ :    4/16: TC 214 TRI 103 HDL 60 LDL 133. 3/15: TC 214, TG 68, HDL 58, LDL 142.    _Lexiscan Myoview_ :    2012: normal.          Impression/Recommendations    ---       **1\. Paroxysmal atrial fibrillation**    Continue Metoprolol Tartrate tablet, 25 mg, 1 tab(s), orally, once daily as  needed  Start Toprol-XL tablet, extended release, 25 mg, 1 tab(s), orally, B.I.D., 90  days, 180, Refills 3    Start Xarelto tablet, 20 mg, 1 tab(s), orally, once a day (in the evening), 90  days, 90 tablets, Refills 3    Clinical Notes: Xarelto was held by GI, never resumed. CHADSVASC- 2 (Female,  HTN), anticoagulation recommended. We will resume Xarelto 20 mg daily,  emphasized taking medication on full stomach for proper absorption. We will  change her back to Toprol XL 25 mg BID which controlled her HR and  palpitations better than tartrate, she can use tartrate for any episodes of  palps. She also was admitted to [facility] after her diverticular bleed for  bothersome palps, echo was performed, we will obtain  records to review.    ---        **2\. Hypercholesterolemia**    Notes: Patient Educated with: AHANutritionSheet.pdf (AHANutritionSheet.pdf).    Clinical Notes: No recent labs to review. PCP following, no statin at this  time. Lifestyle and dietary modifications. Patient has lost almost 10 pounds,  encouraged her to continue in her weight loss efforts.        **3\. Essential (primary) hypertension**    Clinical Notes: Blood pressure moderately elevated. Reinforced low-sodium  diet, continue furosemide and Toprol-XL. Instructed to monitor her blood  pressures over the next one to 2 weeks and to call our office if systolic  blood pressures remain over 140 mmHg.      Preventive Medicine    ---      Counseling: Lifestyle therapeutic issues . Diet . Exercise .    ---      Follow Up    ---    6 Months    Electronically signed by Homero Fellers MD on 12/16/2017 at 12:02 AM EDT    Sign off status: Completed        * * *        [facility]    [address]    [place], [place] [address]    Tel: [phone]    Fax: [phone]              * * *          Patient: Emma [name], Emma [name] DOB: [birth year] Progress Note: Tonna Corner, NP  12/11/2017    ---    Note generated by eClinicalWorks EMR/PM Software ([email or web address])

## 2017-12-19 ENCOUNTER — Ambulatory Visit: Admitting: Internal Medicine

## 2018-01-27 ENCOUNTER — Ambulatory Visit: Admitting: Gastroenterology

## 2018-01-27 NOTE — Procedures (Signed)
__________________________________________________________________________    ENDOSCOPY PROCEDURE NOTE  DATE:  01/27/2018    PROCEDURE:  Colonoscopy with polypectomy.    INDICATIONS:  A 62 year old woman who had a diverticular bleed in December.   Therefore, I found a cecal polyp that I biopsied which came back as serrated  adenoma.  It was intentionally not removed.  Due to GI bleed, she returns for  polyp excision.    MEDICATIONS:  Propofol 390.    ENDOSCOPIST:  Rebbeca Paul, M.D.    INSTRUMENT:  Olympus pediatric colonoscope.    DESCRIPTION OF PROCEDURE:  After informed consent was obtained, the patient was  placed in the left lateral decubitus position.  Perianal inspection showed small  external hemorrhoids.  Digital exam revealed no palpable masses.  Olympus  pediatric colonoscope was gently inserted into the rectum and advanced easily to  the cecum clearly identified by the IC valve and the appendiceal orifice.   Adjacent to the appendiceal orifice was a 9-mm sessile polyp removed by hot  snare polypectomy at a setting of 16 watts and retrieved.  There was pan  diverticular disease throughout the colon, left greater than right, but there  was some significant right-sided diverticular disease as noted.  Retroflexion  exam in the rectum showed grade 2 internal hemorrhoids.    IMPRESSION:  1.  Pan diverticulosis.  2.  Internal and external hemorrhoids.  3.  Cecal polyp status post hot snare polypectomy and retrieval.    RECOMMENDATIONS:  1.  No aspirin or NSAID products.  2.  High fiber, high water intake diet.    3.  Resume Xarelto tomorrow.  4.  Colonoscopy in three years.    Dictated by:  Rebbeca Paul, M.D.    D:  01/27/2018 13:51:09  T:  01/27/2018 13:56:27  E:  01/27/2018 13:56:27  GA/par  Job# [id number]   SIGNATURE LINE    Electronically signed by Elesa Hacker MD, Lannie Fields on 02/03/2018 at 16:31:12 EST

## 2018-01-29 LAB — HX SURG PATH FINAL REPORT
CASE NUMBER: 0
HX FINAL PATHOLOGIC DIAGNOSIS: 6
HX NOTE: 88305

## 2018-02-14 ENCOUNTER — Ambulatory Visit: Admitting: General Acute Care Hospital

## 2018-02-16 LAB — HX .T-SPOT TB
CASE NUMBER: 2019137002493
HX T-SPOT TB: NEGATIVE
HX TB-PANEL-A: 0
HX TB-PANEL-B: 3

## 2018-03-06 ENCOUNTER — Ambulatory Visit: Admitting: Internal Medicine

## 2018-03-06 ENCOUNTER — Ambulatory Visit

## 2018-03-06 LAB — HX IH A1C TEST: HX RESULT: 5.5

## 2018-03-06 NOTE — Progress Notes (Signed)
* * *        Emma [name]**    ------    62 Y old Female, DOB: 1955-11-05    Account Number: [id number]    [address], [place], [place]    Home: [phone]    Guarantor: Emma [name] Insurance: [employer]bs [employer] Payer ID: [id number] S    Appointment Facility: [facility] General Office        * * *    03/06/2018    Progress Notes: Mardene Sayer, M.D.    ------    ---        **Current Medications**    ---    Taking      * flecainide 50 mg tablet 1 tab(s) orally BID     ---    * Tylenol Extra Strength 500 mg tablet 2 tab(s) orally every 6 hours prn     ---    * Xarelto 20 mg tablet 1 tab(s) orally once a day (in the evening)- ON HOLD     ---    * Lasix 40 mg tablet 1 tab(s) orally once a day     ---    * loratadine 10 mg tablet 1 tab(s) orally once a day     ---    * losartan 25 mg tablet 1 tab(s) orally once a day     ---    * Metoprolol Tartrate 25 mg tablet 1 tab(s) orally Twice a day     ---    Not-Taking    * loperamide-simethicone 2 mg-125 mg tablet 1 tab(s) orally Daily as needed for diarrhea, Notes: PRN     ---    * Medication List reviewed and reconciled with the patient     ---      Past Medical History    ---      Paroxysmal Atrial Firbrilation Dr Audley Hose .        ---    HTN .        ---    Obesity .        ---    O/A .        ---    hx of depression she was on Zoloft.        ---    Spinal Stenosis .        ---    2011 colonoscopy at age 53 normal . repeat at age 33, 2019 Serrated adenoma .        ---    A. Fib. admit 08/2016.        ---    12/2016: A fib admit to [facility] for Ablation, complications, post ablatipon  Pericarditis. - Unsuccessful.        ---    12/2016: Cardiac Cath False POS stress test, normal Coronary arteries,  Diastolic HF w/ elevated heart filling pressures. normal LV systolic function.  Marland Kitchen        ---    08/2017: Admit [facility] GI bleed , On Xarelto ( in setting of additinal NSAIDs ) .        ---    08/2017: ColoDiverticular disease and Cecal Polyp. (  Sessile Serrated Adenoma  ) Likely bleeding Diverticulum. Marland Kitchen        ---    08/2017: EGD normal.        ---    08/2017: NM red Cell tracer, GI blood loss Imaging. - Normal. .        ---      **  Surgical History**    ---      C section    ---    Rhinoplasty 1875    ---    Tota left knee replacement 05/2011    ---    GI bleed 08/2017    ---      **Family History**    ---      Father: deceased 60 yrs, MI x2, diagnosed with Heart Disease    ---    Mother: deceased 13 yrs, Hodgkins Lymphoma, Sarcoma, s/p cholecystectomy,  shingles    ---    Daughter 1: alive 24 yrs, [place]    ---    Daughter 2: alive 22 yrs, [place] [facility]    ---    Brother 1: alive, Chronic Schizophreni c, Mental Illness    ---    Brother 2: alive, HTN, Hypertension    ---    Sister 1: alive 72 yrs, HTN , CAD, Lewy Body dementia, Hypertension, Heart  Disease    ---    Sister 2: alive 48 yrs, SLE , HTN, Hypertension    ---    2 brother(s) , 2 sister(s) - healthy. 2daughter(s) - healthy.    ---    brother Tumor on his sinus.    ---      **Social History**    ---    Tobacco Use  Are you a? **Former smoker quit at the age of 62** , Patient was  counseled on the dangers of tobacco use: **09/05/2017** .    Alcohol 1 per week.    Drug use: yes, Past use: marrijauana.Marland KitchenMarland KitchenPCP a couple times as a teen .    Marital Status: Married.    Children: two children.    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    Exercise: none....except physical therapy.    Sexually active: yes .    Depression Screening I  Little interest or pleasure in doing things **Nearly  every day** , Feeling down, depressed, or hopeless **Nearly every day** ,  Trouble falling or staying asleep, or sleeping too much **Nearly every day** ,  Feeling tired or having little energy **Nearly every day** , Poor appetite or  overeating **Nearly every day** , Feeling bad about yourself - or that you are  a failure or have let yourself or your family down **Not at all** , Trouble  concentrating on things, such as  reading the newspaper or watching television  **Not at all** , Moving or speaking so slowly that other people could have  noticed. Or the opposite-being so figety or resltess that you have been moving  around a lot more than usual **Not at all** , Thoughts that you would be  better off dead, or of hurting yourself **Not at all** , Total Score **15** ,  Interpretation **Moderately severe depression** .    MOLST Form  Does patient have completed form? **No** , MOLST Form discussed  on: **61/19/2015** .    HCP  Does patient have a current proxy? **No** , HCP was discussed:  **11/19/2013** .    COPD Population Screener Total Score: 0.      **Gyn History**    ---    Last pap smear date 05/2015 Negative, HPV Negative.    Last mammogram date 12/19/2017.    Date of Last Period D and C 08/2012 ...linning thickness...Dr. Daphine Deutscher. in  [place].    Menarch Menopause 55, menarch .      **OB History**    ---  Total pregnancies 2\.    Total living children 2\.      **Allergies**    ---      Ace inhibitors: cough - Allergy    ---      **Hospitalization/Major Diagnostic Procedure**    ---      [facility] for A fib, Unsuccessful. 12/2016    ---    [facility] 06/2017    ---      **Review of Systems**    ---    _CPE FULL ROS_ :    no Visual change. no Hearing loss. no URI symptoms. no Headaches. no Oral  lesions. no Shortness of breath. no Chest pain. no Palpitations. no Back Pain.  no Dysuria. no Urinary frequency or incontinence,  tried oxybutin , negative,  weight loss advised  . no Diarrhea or constipation. no Rectal bleeding. no  Weakness or numbness. no Lightheadedness or dizziness. no Change in skin  lesions. no Edema. no Difficulty with speech or ambulation. no Arthralgias or  myalgias. no Significant weight loss or gain. no Mood changes. no Nausea or  vomiting. no Reflux symptoms. no Abdominal Pain.            **Reason for Appointment**    ---      1\. Annual visit    ---       **History of Present Illness**    ---    _Interim History_ :    62 year old female, PMH as noted below, presents for annual exam. Meds as per  list. Last labs reviewed..moving to [employer], house is on [address].    Mammogram done 11/2017    Last A1c- 5.4, done 03/2017    Hep C screen neg 01/2016    Due for Pap, last pap done 05/2015-HPV neg    Colonoscopy UTD    Shingrix advised.      **Vital Signs**    ---    Wt 326, Ht 65, BP 132/76 wrist, BMI  54.24  , HR 64, O2 Sat 95%RA.      **Past Orders**    ---    _Lab:stool cards (Order Date - 08/29/2017) (Collection Date - 09/13/2017)_    Result: neg x3    _Lab:CBC w/ Differential (Order Date - 09/05/2017) (Collection Date -  09/05/2017)_    Result: 29.2/10.0    Hct    29.2    L    36.0-47.0 - %    Hgb    10.0    L    11.8-16.0 - Gm/dL    MCH    56.4      33.2-95.1 - pGm    MCHC    34.2      31.0-37.0 - Gm/dL    MCV    88.4      16.6-063.0 - fL    MPV    9.0    L    9.4-12.4 - fL    Platelet    306      150-400 - thous/mm3    RBC    3.27    L    3.90-5.20 - Mil/mm3    WBC    6.5      3.7-11.2 - thous/mm3    RDW-SD    43.7      35.0-51.0 - fL    _Lab:Comprehensive Metabolic Panel (Order Date - 09/05/2017) (Collection Date  - 09/05/2017)_    Result: normal    Albumin Lvl  3.3      3.2-5.0 - Gm/dL    Alk Phos    94      30-117 - Units/L    ALT    23      6-55 - Units/L    AST    20      6-40 - Units/L    Bili Total    0.3      0.2-1.2 - mg/dL    BUN    12      1-61 - mg/dL    Calcium Lvl    9.3      8.5-10.5 - mg/dL    Chloride    096      98-110 - mmol/L    CO2    28      21-32 - mmol/L    Creatinine    0.619      0.550-1.300 - mg/dL    Glucose Lvl    045      70-110 - mg/dL    Potassium Lvl    3.9      3.6-5.2 - mmol/L    Sodium Lvl    144      136-146 - mmol/L    Total Protein    6.5      6.0-8.4 - Gm/dL    Anion Gap    8      3-11 -    _Lab:Glomerular Filtration Rate (Estimated) (Order  Date - 09/05/2017)  (Collection Date - 09/05/2017)_    Result: normal    Afn Amer GFR    >90      \- ml/min    Non-Afn Amer GFR    >90      \- ml/min    _Lab:.Automated diff (Order Date - 09/05/2017) (Collection Date - 09/05/2017)_    Result: normal    Basophil Auto    1.1      \- %    Neutro Auto    59.4      \- %    Eos Auto    5.7      \- %    Lymph Auto    26.0      \- %    Mono Auto    7.3      \- %    IG Auto    0.5      0.0-2.0 - %    Abs Neut Ct    3.89      1.48-7.95 - thous/mm3      **Examination**    ---    _General Examination_ :    General Appearance:  Well appearing, in NAD  .    Skin:  No abnormal lesions  .    HEENT:  Head is atraumatic and normocephalic. PERRL. Sclerae are anicteric.  Conjunctivae are not injected. Funduscopic exam reveals clear discs  bilaterally, without exudate or hemorrhage. Mucous membranes are moist,  without oral lesions. Nasal passages are clear. TMs are clear bilaterally  .    Neck, Thyroid :  Supple. Carotid upstroke is intact, without bruit, cervical  adenopathy, or thyromegaly noted  ,  no JVD  .    Breasts :  Normal bilaterally, without palpable mass or nipple discharge.  There is no axillary adenopathy  .    Heart:  Regular rate and rhythm. There is no murmur or click appreciated  .    Lungs:  clear to auscultation bilaterally  .    Abdomen:  Positive bowel  sounds. Soft and nontender, without mass,  organomegaly, or bruit  .    Female Genitourinary:  normal external genitalia  ,  vagina - pink moist  mucosa, no lesions or abnormal discharge  ,  cervix- no discharge, lesions or  CMT  , pap obtained without difficulty,  adnexa - no masses or tenderness  ,  uterus - nontender and normal size on palpation.  .    Extremities:  No cyanosis, clubbing, or edema  .    Peripheral pulses:  Exam reveals all pulses to be intact, without bruit  .    Neurologic Exam:  Patient is alert and oriented x3. Cranial nerves II through  XII are intact. Sensory exam  is intact to light touch. Motor exam is 5/5 in  all muscle groups tested. Reflexes are 2/2 diffusely. Toes are downgoing  bilaterally  .          **Assessments**    ---    1\. 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 Routine medical exam - Z00.00    ---    3\. Visit for routine gyn exam - Z01.419    ---    4\. Screening for malignant neoplasm of the cervix - Z12.4    ---    5\. Iron deficiency anemia due to chronic blood loss - D50.0    ---    6\. Chronic diastolic heart failure - I50.32    ---    7\. Obstructive sleep apnea syndrome - G47.33    ---    8\. Chronic atrial fibrillation - I48.2    ---    9\. Morbid obesity - E66.01    ---    10\. Adult BMI 50.0-59.9 kg/sq m - Z68.43    ---    11\. 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 Glucose intolerance**    _LAB: IH A1c Test_ 5.5      Santos,Amanda 03/06/2018 1:44:33 PM >    ------        Clinical Notes: ADA diet with mostly vegetables and leaner meats advised.  MInimun of 200 minutes of exercise a week, increase water, caffeine once a  day. Weight loss encouraged for BMI under 25. recheck A1c every 6 months.  Discussed benefits of metformin.        **2\. Routine medical exam**    Clinical Notes: High fiber, low fat diet, nonimpact aerobic exercise, yearly  flu shot, yearly mammography. Surveillance colonoscopy up to date.        **3\. Visit for routine gyn exam**    Clinical Notes:  diffuclt exam...repeat in one year  .        **4\. Screening for malignant neoplasm of the cervix**    _LAB: Gyn Final Report_     This lab was reviewed by Ebbie Ridge on  12/28/2018 at 13:17 PM EDT    ------    _LAB: HPV High Risk by TMA_   This lab was reviewed by Ebbie Ridge on  12/28/2018 at 13:17 PM EDT    ------        Clinical Notes:  Screen today for HPV and PAP smear if both results negative I  would repeat pap in 3 -5 years per current guidelines  .        **5\. Iron deficiency anemia due to  chronic blood loss**  _LAB: CBC w/ Diff_     This lab was reviewed by Mardene Sayer on  12/24/2018 at 21:51 PM EDT    ------        Clinical Notes:  recehck cbc ...repleat iron if still deficient  .        **6\. Chronic diastolic heart failure**    Clinical Notes:  no signs of decompensated CHF, patient will call if lef edema  or sob  .        **7\. Obstructive sleep apnea syndrome**    Clinical Notes:  Conitnue regular use of CPAP.Marland Kitchenpatient has noted a therapeutic  benefit  .        **8\. Chronic atrial fibrillation**    Clinical Notes:  Stable...Marland Kitchenheart seems regular today, on eliquis  .        **9\. Morbid obesity**    Clinical Notes:  Discussed etiology of obesity and weight watchers /others  online weight loss groups Discussed surgical and endoscopic options for  treatment of obesity as well. Encouraged low carb diet/IF/exercise as well as  avoiding processed foods.Information given as well she declined surgical  intervention  .        **10\. Adult BMI 50.0-59.9 kg/sq m**    Clinical Notes:  bmi goal les than 25  .        **11\. Hypertensive heart disease with heart failure**    Clinical Notes: Same meds. Check above surveillance labs. BP goal 135/85 or  less.      **Preventive Medicine**    ---      Counseling: Diet . Exercise . Sunscreen . Pap smear . Seatbelts .  Calcium/Vitamin D Supplement .    ---    Self breast exams, routine eye exams.        ---      **Visit Codes**    ---      [id number] ESTAB PT HEALTHY 40-64 YEARS.    ---      **Procedure Codes**    ---      60454 GLYCATED HEMOGLOBIN TEST    ---    G0101 PELVIC/BREAST EXAM-CA SCREEN    ---    U9811 PAP SMEAR EXAM    ---      **Follow Up**    ---    6 Months, 1 Year. 1 Year, sooner for urgent issues    Electronically signed by Mardene Sayer , M.D. on 03/20/2018 at 05:18 AM EDT    Sign off status: Completed        * * *        [facility]    [address]    [address]rd Floor, [address]    [place], [place] [address]    Tel:  [phone]    Fax: [phone]              * * *          Patient: TERESINA, BUGAJ DOB: [birth year] Progress Note: Mardene Sayer,  M.D. 03/06/2018    ---    Note generated by eClinicalWorks EMR/PM Software ([email or web address])

## 2018-03-07 ENCOUNTER — Ambulatory Visit: Admitting: Internal Medicine

## 2018-03-07 LAB — HX COMPREHENSIVE METABOLIC PANEL
CASE NUMBER: 2019158000935
HX ALBUMIN LVL: 3.3 g/dL — NL (ref 3.2–5.0)
HX ALKALINE PHOSPHATASE: 114 U/L — NL (ref 30.0–117.0)
HX ALT: 23 U/L — NL (ref 6.0–55.0)
HX ANION GAP: 8 — NL (ref 3.0–11.0)
HX AST: 18 U/L — NL (ref 6.0–40.0)
HX BILIRUBIN TOTAL: 0.5 mg/dL — NL (ref 0.2–1.2)
HX BUN: 17 mg/dL — NL (ref 8.0–23.0)
HX CALCIUM LVL: 9.8 mg/dL — NL (ref 8.5–10.5)
HX CHLORIDE: 108 mmol/L — NL (ref 98.0–110.0)
HX CO2: 29 mmol/L — NL (ref 21.0–32.0)
HX CREATININE: 0.68 mg/dL — NL (ref 0.55–1.3)
HX GLUCOSE LVL: 80 mg/dL — NL (ref 70.0–110.0)
HX POTASSIUM LVL: 4.1 mmol/L — NL (ref 3.6–5.2)
HX SODIUM LVL: 145 mmol/L — NL (ref 136.0–146.0)
HX TOTAL PROTEIN: 6.7 g/dL — NL (ref 6.0–8.4)

## 2018-03-07 LAB — HX TSH
CASE NUMBER: 2019158000935
HX 3RD GEN TSH: 3.76 u[IU]/mL — ABNORMAL HIGH (ref 0.358–3.74)

## 2018-03-07 LAB — HX LIPID PANEL
CASE NUMBER: 2019158000935
HX CHOL: 176 mg/dL — NL
HX HDL: 56 mg/dL — NL
HX LDL: 110 mg/dL — NL
HX TRIG: 51 mg/dL — NL

## 2018-03-07 LAB — HX CORTISOL LEVEL
CASE NUMBER: 2019158000935
HX CORTISOL LVL: 9.7 ug/dL

## 2018-03-07 LAB — HX GLOMERULAR FILTRATION RATE (ESTIMATED)
CASE NUMBER: 2019158000935
HX AFN AMER GLOMERULAR FILTRATION RATE: >90
HX NON-AFN AMER GLOMERULAR FILTRATION RATE: >90

## 2018-03-07 NOTE — Progress Notes (Signed)
* * *        [name]**    ------    45 Y old Female, DOB: [birth year]    [address], [place], [place], [place] [address]    Home: [phone]    Provider: Mardene Sayer        * * *    Web Encounter    ---    Answered by    Clemon Chambers    Date: 03/07/2018        Time: 08:30 AM    Caller    [facility]    ------            Reason    Pap- LVM            Message                      Pap was received yesterday at [facility] that doesn't match the order. The order says Maila Dukes 1955-11-26 but the pap says Inaara Tye 27-Apr-1956. [phone]                Action Taken                      Stafford,Sheba  03/07/2018 11:43:34 AM > [facility] called back... leaving soon, needs a call back.      Santos,Amanda  03/07/2018 12:04:14 PM > spoke with Carollee Herter, unable to proceed with pap. Needs a new collection. Called patient, left voice mail requesting call back.       Rae Halsted  03/07/2018 1:48:52 PM > spoke with Carollee Herter- they refused to except this, saying they have policies they have to go by, she states that if you speak with Dr. Jomarie Longs, could possibley be over rided. thanks       Dejae Bernet  03/07/2018 2:20:37 PM > I called pathology, waiting to call back from Dr Jomarie Longs..let them know to hold specimen      Rae Halsted  03/07/2018 2:54:05 PM > Called Pathology- just got the run around regarding pap, spoke with Alan Ripper and she said that the covering MD for Dr. Jomarie Longs, said that the pap needs to be either redone, or re labeled, so I asked to get transfered back to shannon who I spoke with earlier and LVM regarding having the pap sent back up to [place] to re label this.      Leanny Moeckel  03/07/2018 3:37:39 PM > ok see if [facility] can go down to lab anf do it      Rae Halsted  03/07/2018 3:58:13 PM > KR- did any one bring a pap back up?       Rose,Kristen  03/07/2018 4:01:17 PM > nope      Rae Halsted  03/07/2018 4:01:51 PM >       Rae Halsted   03/12/2018 11:03:26 AM > AS=- did you speak with someone regarding this?       Santos,Amanda  03/12/2018 11:14:00 AM > no, I tried calling regarding this, but no one returned call back.      Rae Halsted  03/12/2018 11:47:15 AM > called Carollee Herter at pathology- had to LVM with Amy dumois, support service manager of the path lab, regarding above... waiting to hear back.      Rae Halsted  03/13/2018 10:55:47 AM > called Amy again... did not hear anything back from yesterday (03/12/18).... had to LVM again... waiting to hear back.  Shawntez Dickison  03/13/2018 1:09:28 PM > please call Marcea...explain the situation, apologize to her...out mistake, can come at her convinience for pap smear....tks she is leaving the practive to another state so ideally I would lie this done      [facility]  03/13/2018 1:36:06 PM >       Santos,Amanda  03/14/2018 4:27:18 PM > informed patient of above, pt understands and agrees with plan of care. Pap scheduled for 03/27/18 with TM      Mansur,Tarina  03/14/2018 4:45:36 PM >                     * * *                ---          * * *          Patient: Emma [name], Emma [name] DOB: Jul 23, 1956 Provider: Mardene Sayer  03/07/2018    ---    Note generated by eClinicalWorks EMR/PM Software ([email or web address])

## 2018-03-08 LAB — HX INSULIN LEVEL TOTAL
CASE NUMBER: 2019158000935
HX INSULIN LVL: 7 uU/mL — NL

## 2018-03-09 ENCOUNTER — Ambulatory Visit: Admitting: Internal Medicine

## 2018-03-09 NOTE — Progress Notes (Signed)
* * *        **  Emma [name]**    ------    77 Y old Female, DOB: [birth year]    [address] [place], [place], [place] [address]    Home: [phone]    Provider: Mardene Sayer        * * *    Web Encounter    ---    Answered by    Ebbie Ridge    Date: 03/09/2018        Time: 04:40 PM    Caller    Emma [name]    ------            Reason    New Refill Request            Message                      ***Start of Medication List: ***       losartan 25 mg 1 tab(s)  orally once a day               with no refill(s)      ***End of Med List ***       Please send refill to [facility] main campus pharmacy. Also , can I get a ninety day refill instead of just 30 days.                Action Taken                      Rose,Kristen  03/10/2018 7:43:06 AM > Dear Emma [name],            The requested prescription(s) have been sent to your pharmacy. If you have any questions or concerns about your recent prescription refills, please contact us via patient portal.             Regards,             The Staff at [facility]                      Refills    Refill losartan tablet, 25 mg, orally, 90, 1 tab(s), once a day,  90 days, Refills=3    ------          * * *        **eMessages**   From:    Rose,Kristen    ------    Created:    2018-03-10 07:43:23.0    Sent:      Subject:    RE:New Refill Request    Message:      Dear Emma [name],    The requested prescription(s) have been sent to your pharmacy. If you have any  questions or concerns about your recent prescription refills, please contact  us via patient portal.    Regards,    The Staff at [facility]                        ---          * * *          Patient: Emma [name], Emma [name] DOB: March 01, 1956 Provider: Mardene Sayer  03/09/2018    ---    Note generated by eClinicalWorks EMR/PM Software ([email or web address])

## 2018-03-27 ENCOUNTER — Ambulatory Visit: Admitting: Medical

## 2018-04-01 ENCOUNTER — Ambulatory Visit: Admitting: Cardiovascular Disease

## 2018-04-01 ENCOUNTER — Ambulatory Visit

## 2018-04-01 NOTE — Progress Notes (Signed)
* * *        **  [name]    --- ---    65 Y old Female, DOB: July 26, 1956    [address] [place], [place] [address]    Home: [phone]    Provider: [name], MD        * * *    Telephone Encounter    ---    Answered by   Emi Holes  Date: 04/01/2018         Time: 10:54 AM    Reason   lasix dose    --- ---            Action Taken                      SHEPARD,PATRICIA , RN 04/01/2018 10:55:20 AM > s/w with patient to clarify lasix order. She is taking 40 mg daily. Med list corrected.                    * * *              * * *        ---        Reason for Appointment    ---      1\. Lasix dose    ---      Current Medications    ---    Taking     * furosemide 40 mg tablet 1 tab(s) orally daily    ---    Unknown    * loratadine 10 mg tablet 1 tab(s) orally once a day    ---    * Tylenol Extra Strength 500 mg tablet 2 tab(s) orally PRN    ---    * flecainide 50 mg tablet 1 tab(s) orally B.I.D.    ---    * Metoprolol Tartrate 25 mg tablet 1 tab(s) orally once daily as needed    ---    * Toprol-XL 25 mg tablet, extended release 1 tab(s) orally B.I.D.    ---    * Xarelto 20 mg tablet 1 tab(s) orally once a day (in the evening)    ---    * Medication List reviewed and reconciled with the patient    ---          * * *          Patient: MAKAILEY, HODGKIN A DOB: November 14, 1955 Provider: [name],  MD 04/01/2018    ---    Note generated by eClinicalWorks EMR/PM Software ([email or web address])

## 2018-05-19 ENCOUNTER — Ambulatory Visit: Admitting: Internal Medicine

## 2018-05-19 NOTE — Progress Notes (Signed)
* * *        **  Emma [name]**    ------    68 Y old Female, DOB: 26-Aug-1956    [address], [place], [place], [place] [address]    Home: [phone]    Provider: Mardene Sayer        * * *    Telephone Encounter    ---    Answered by    Rae Halsted    Date: 05/19/2018        Time: 09:19 AM    Reason    faxed    ------            Message                      refill request for [facility]-                Action Taken                      [facility]  05/19/2018 9:21:49 AM > faxed                Refills    Refill Xarelto tablet, 20 mg, orally, 90, 1 tab(s), once a day (in  the evening)- ON HOLD, 90 days, Refills=4    ------          * * *                ---          * * *          Patient: Emma [name] DOB: Oct 11, 1955 Provider: Mardene Sayer  05/19/2018    ---    Note generated by eClinicalWorks EMR/PM Software ([email or web address])

## 2018-06-04 ENCOUNTER — Ambulatory Visit: Admitting: Internal Medicine

## 2018-06-04 NOTE — Progress Notes (Signed)
* * *        **  Emma [name]**    ------    89 Y old Female, DOB: 1955-10-19    [address], [place], [place], [place] [address]    Home: [phone]    Provider: Mardene Sayer        * * *    Telephone Encounter    ---    Answered by    Rae Halsted    Date: 06/04/2018        Time: 10:07 AM    Reason    refill request    ------            Message                      refill request for Losartan                 Action Taken                      Rae Halsted  06/04/2018 10:08:32 AM > faxed                Refills    Refill losartan tablet, 25 mg, orally, 90, 1 tab(s), once a day,  90 days, Refills=4    ------          * * *                ---          * * *          Patient: Emma [name] DOB: 09/23/1956 Provider: Mardene Sayer  06/04/2018    ---    Note generated by eClinicalWorks EMR/PM Software ([email or web address])

## 2018-06-06 ENCOUNTER — Ambulatory Visit: Admitting: Internal Medicine

## 2018-06-06 NOTE — Progress Notes (Signed)
* * *        **  Emma [name]**    ------    41 Y old Female, DOB: Sep 06, 1956    [address] [place], [place], [place] [address]    Home: [phone]    Provider: Mardene Sayer        * * *    Telephone Encounter    ---    Answered by    Bea Laura    Date: 06/06/2018        Time: 01:45 PM    Caller    Vikki Ports    ------            Reason    Refills            Message                      See previous TE.... per patient, pharmacy has not received script for Losartan.                 Action Taken                      [facility]  06/06/2018 2:07:15 PM > refaxed                Refills    Refill losartan tablet, 25 mg, orally, 90, 1 tab(s), once a day,  90 days, Refills=4    ------          * * *                ---          * * *          Patient: Emma [name] DOB: 1956/05/25 Provider: Mardene Sayer  06/06/2018    ---    Note generated by eClinicalWorks EMR/PM Software ([email or web address])

## 2018-06-09 ENCOUNTER — Ambulatory Visit: Admitting: Internal Medicine

## 2018-06-09 NOTE — Progress Notes (Signed)
* * *        **  [name]**    ------    86 Y old Female, DOB: [birth year]    [address] [place], [place], [place] [address]    Home: [phone]    Provider: Mardene Sayer        * * *    Web Encounter    ---    Answered by    Carlynn Herald    Date: 06/09/2018        Time: 01:10 PM    Reason    script not at pharmacy    ------            Message                      Patient calling stating her script still not at the pharmacy and she has now  ran out of it.Marland KitchenMarland KitchenShe asked if we can call it in instead of faxing..Please calll [facility] above.                Action Taken                      Torres,Jacqueline  06/09/2018 1:43:18 PM > Pt found the script. Please disregard.                    * * *                ---          * * *          Patient: Emma [name], Emma [name] DOB: 1955/11/09 Provider: Mardene Sayer  06/09/2018    ---    Note generated by eClinicalWorks EMR/PM Software ([email or web address])

## 2018-06-17 ENCOUNTER — Ambulatory Visit: Admitting: Internal Medicine

## 2018-06-17 NOTE — Progress Notes (Signed)
* * *        **  Emma [name]**    ------    72 Y old Female, DOB: Aug 20, 1956    [address] [place], [place], [place] [address]    Home: [phone]    Provider: Mardene Sayer        * * *    Telephone Encounter    ---    Answered by    Park Liter    Date: 06/17/2018        Time: 02:20 PM    Reason    Refills    ------            Message                      Pt needs a [facility] [place] [place] Ph [phone].      Any questions call back [phone]                Action Taken                      Pinto,Lillian  06/17/2018 2:30:56 PM > script sent                 Refills    Refill Metoprolol Tartrate tablet, 25 mg, orally, 180, 1 tab(s),  Twice a day, 90 days, Refills=2    ------          * * *                ---          * * *          Patient: Emma [name] DOB: June 03, 1956 Provider: Mardene Sayer  06/17/2018    ---    Note generated by eClinicalWorks EMR/PM Software ([email or web address])

## 2018-06-19 ENCOUNTER — Ambulatory Visit: Admitting: Internal Medicine

## 2018-06-19 NOTE — Progress Notes (Signed)
* * *        **  [name]**    ------    26 Y old Female, DOB: [birth year]    [address] [place], [place], [place] [address]    Home: [phone]    Provider: Mardene Sayer        * * *    Web Encounter    ---    Answered by    Chinita Pester    Date: 06/19/2018        Time: 02:09 PM    Caller    [name]/Patient    ------            Reason    refill another prescription            Message                      Metoprolol long acting Sulsinate 25mg  2x per day. Patient is out and needs to take the medication to be sent to the pharmacy above. The tartrate was sent but she is asking for the sulsinate.                 Action Taken                      Rae Halsted  06/19/2018 2:40:27 PM > [facility] metoprolol Succ was discontinued back in 2016, and she has been on tartrate? now she wants to be on the Succ, not sure what I should be sending. thanks       Rae Halsted  06/19/2018 2:59:02 PM > stopped the Tatrate and continued with the Succ, see RX box, faxed new script                 Refills    Stop Metoprolol Tartrate tablet, 25 mg, orally, 1 tab(s), Twice a  day    ------      Refill Metoprolol Succinate ER tablet, extended release, 25 mg, orally, 180  Tablet, 1 tab(s), BID, 90 days, Refills=3          * * *                ---          * * *          Patient: Emma [name], Emma [name] DOB: [birth year] Provider: Mardene Sayer  06/19/2018    ---    Note generated by eClinicalWorks EMR/PM Software ([email or web address])

## 2018-06-24 ENCOUNTER — Ambulatory Visit: Admitting: Internal Medicine

## 2018-06-24 NOTE — Progress Notes (Signed)
* * *        **  Emma [name]**    ------    54 Y old Female, DOB: 16-Feb-1956    [address], [place], [place], [place] [address]    Home: [phone]    Provider: Mardene Sayer        * * *    Telephone Encounter    ---    Answered by    Park Liter    Date: 06/24/2018        Time: 08:35 AM    Reason    Refills    ------            Message                      Pt would like a refill for [facility] 40 MG [facility] [place] [place]                Action Taken                      Pinto,Lillian  06/24/2018 8:36:30 AM > Script sent                 Refills    Refill Lasix tablet, 40 mg, orally, 90, 1 tab(s), once a day, 90  days, Refills=1    ------          * * *                ---          * * *          Patient: Emma [name] DOB: 1955-10-17 Provider: Mardene Sayer  06/24/2018    ---    Note generated by eClinicalWorks EMR/PM Software ([email or web address])

## 2018-07-03 ENCOUNTER — Ambulatory Visit: Admitting: Internal Medicine

## 2018-07-03 NOTE — Progress Notes (Signed)
* * *        **  Emma [name]**    ------    25 Y old Female, DOB: 06-May-1956    [address], [place], [place], [place] [address]    Home: [phone]    Provider: Mardene Sayer        * * *    Telephone Encounter    ---    Answered by    Park Liter    Date: 07/03/2018        Time: 11:26 AM    Reason    Refills    ------            Message                      [facility]                Action Taken                      Pinto,Lillian  07/03/2018 1:43:30 PM > script sent                 Refills    Refill Xarelto tablet, 20 mg, orally, 90, 1 tab(s), once a day (in  the evening)- ON HOLD, 90 days, Refills=3    ------          * * *                ---          * * *          Patient: Emma [name] DOB: 07/05/1956 Provider: Mardene Sayer  07/03/2018    ---    Note generated by eClinicalWorks EMR/PM Software ([email or web address])

## 2018-07-13 ENCOUNTER — Ambulatory Visit: Admitting: Internal Medicine

## 2018-07-13 NOTE — Progress Notes (Signed)
* * *        **  [name]**    ------    71 Y old Female, DOB: [birth year]    [address], [place], [place], [place] [address]    Home: [phone]    Provider: Mardene Sayer        * * *    Telephone Encounter    ---    Answered by    Kennith Maes    Date: 07/13/2018        Time: 11:07 AM    Message                      TC from patient..has moved to [place], out of her flecainide..seeing a new cardiologist next week..Will provide a 30 day scrpt fo rnow        ------            Action Taken                      Pickul,David C 07/13/2018 11:07:34 AM > MC, FYI      Madolyn Ackroyd  07/14/2018 4:01:41 PM > aware tks                Refills    Refill flecainide tablet, 50 mg, orally, 60 Tablet, 1 tab(s), BID,  30 days, Refills=0    ------          * * *              * * *        ---        Current Medications    ---    Taking      * flecainide 50 mg tablet 1 tab(s) orally BID     ---    * Tylenol Extra Strength 500 mg tablet 2 tab(s) orally every 6 hours prn     ---    * loratadine 10 mg tablet 1 tab(s) orally once a day     ---    * losartan 25 mg tablet 1 tab(s) orally once a day     ---    * Metoprolol Succinate ER 25 mg tablet, extended release 1 tab(s) orally BID     ---    * Lasix 40 mg tablet 1 tab(s) orally once a day     ---    * Xarelto 20 mg tablet 1 tab(s) orally once a day (in the evening)- ON HOLD     ---    Unknown    * loperamide-simethicone 2 mg-125 mg tablet 1 tab(s) orally Daily as needed for diarrhea, Notes: PRN     ---      Treatment    ---      **1\. Others**    Refill flecainide tablet, 50 mg, 1 tab(s), orally, BID, 30 days, 60 Tablet,  Refills 0    ---          * * *          Patient: JAYLIANI, WANNER DOB: 1956-02-19 Provider: Mardene Sayer  07/13/2018    ---    Note generated by eClinicalWorks EMR/PM Software ([email or web address])

## 2018-07-15 ENCOUNTER — Ambulatory Visit: Admitting: Internal Medicine

## 2018-07-15 NOTE — Progress Notes (Signed)
* * *        **  [name]**    ------    24 Y old Female, DOB: 1956/06/22    [address] [place], [place], [place] [address]    Home: [phone]    Provider: Mardene Sayer        * * *    Telephone Encounter    ---    Answered by    Park Liter    Date: 07/15/2018        Time: 08:40 AM    Reason    Refills    ------            Message                      Pt needs a refill for Flecainide                 Action Taken                      Rose,Kristen  07/15/2018 8:57:14 AM > Dear Vikki Ports,            The requested prescription(s) have been sent to your pharmacy. If you have any questions or concerns about your recent prescription refills, please contact us via patient portal.             Regards,             The Staff at [facility]                      Refills    Refill flecainide tablet, 50 mg, orally, 180, 1 tab(s), BID, 90  days, Refills=1    ------          * * *        **eMessages**   From:    Rose,Kristen    ------    Created:    2018-07-15 08:57:32.0    Sent:      Subject:    [id number]    Message:      Dear Vikki Ports,    The requested prescription(s) have been sent to your pharmacy. If you have any  questions or concerns about your recent prescription refills, please contact  us via patient portal.    Regards,    The Staff at [facility]                        ---          * * *          Patient: SHYA, KOVATCH DOB: 05-Jan-1956 Provider: Mardene Sayer  07/15/2018    ---    Note generated by eClinicalWorks EMR/PM Software ([email or web address])

## 2018-07-17 ENCOUNTER — Ambulatory Visit: Admitting: Cardiovascular Disease

## 2018-07-17 NOTE — Progress Notes (Signed)
* * *        **  Emma [name]    --- ---    69 Y old Female, DOB: 1956-03-06    [address] [place], [place] [address]    Home: [phone]    Provider: [name], MD        * * *    Telephone Encounter    ---    Answered by   Cletus Gash  Date: 07/17/2018         Time: 12:11 PM    Caller   Pt    --- ---            Reason   medical records            Message                      Pt is seeing a new cardiologist in [facility] & needs her records faxed to him--Dr Nanetta Batty--      [facility]--(F) [phone]  (pt had appt next Tuesday)--so they need to be faxed ASAP Please!  Thanks!                Action Taken                      [name],[name]  07/17/2018 12:25:08 PM > Faxed already this AM see FAX Log                     * * *                ---          * * *          Patient: Emma [name], Emma [name] A DOB: [birth year] Provider: [name],  MD 07/17/2018    ---    Note generated by eClinicalWorks EMR/PM Software ([email or web address])

## 2018-07-22 ENCOUNTER — Ambulatory Visit (INDEPENDENT_AMBULATORY_CARE_PROVIDER_SITE_OTHER): Payer: Self-pay | Admitting: Cardiovascular Disease

## 2018-07-22 ENCOUNTER — Encounter: Payer: Self-pay | Admitting: Cardiovascular Disease

## 2018-07-22 VITALS — BP 124/86 | HR 55 | Ht 64.0 in | Wt 311.0 lb

## 2018-07-22 DIAGNOSIS — I480 Paroxysmal atrial fibrillation: Secondary | ICD-10-CM

## 2018-07-22 DIAGNOSIS — I10 Essential (primary) hypertension: Secondary | ICD-10-CM

## 2018-07-22 DIAGNOSIS — G4733 Obstructive sleep apnea (adult) (pediatric): Secondary | ICD-10-CM | POA: Insufficient documentation

## 2018-07-22 NOTE — Assessment & Plan Note (Signed)
History of essential hypertension her blood pressure measured today at 124/86.  She is on metoprolol and losartan

## 2018-07-22 NOTE — Assessment & Plan Note (Signed)
History of obstructive sleep apnea on CPAP. 

## 2018-07-22 NOTE — Assessment & Plan Note (Signed)
History of morbid obesity with BMI 53 attempting to lose weight with diet

## 2018-07-22 NOTE — Assessment & Plan Note (Signed)
History of PAF on Xarelto and flecainide.  She has had an unsuccessful A. fib ablation at [facility] in [place] 2 years ago.  We talked about avoiding caffeine.

## 2018-07-22 NOTE — Patient Instructions (Addendum)
Medication Instructions:  Your physician recommends that you continue on your current medications as directed. Please refer to the Current Medication list given to you today.  If you need a refill on your cardiac medications before your next appointment, please call your pharmacy.   Lab work: Your physician recommends that you return for lab work in: FASTING  If you have labs (blood work) drawn today and your tests are completely normal, you will receive your results only by: Marland Kitchen MyChart Message (if you have MyChart) OR . A paper copy in the mail If you have any lab test that is abnormal or we need to change your treatment, we will call you to review the results.  Testing/Procedures: Your physician has requested that you have an echocardiogram. Echocardiography is a painless test that uses sound waves to create images of your heart. It provides your doctor with information about the size and shape of your heart and how well your heart's chambers and valves are working. This procedure takes approximately one hour. There are no restrictions for this procedure.   Follow-Up: At [facility], you and your health needs are our priority.  As part of our continuing mission to provide you with exceptional heart care, we have created designated Provider Care Teams.  These Care Teams include your primary Cardiologist (physician) and Advanced Practice Providers (APPs -  Physician Assistants and Nurse Practitioners) who all work together to provide you with the care you need, when you need it. We request that you follow-up in: 6 months with an extender and in 12 months with Dr San Morelle will receive a reminder letter in the mail two months in advance. If you don't receive a letter, please call our office to schedule the follow-up appointment. Corine Shelter, PA-C [place], [place] . Marjie Skiff, PA-C  Any Other Special Instructions Will Be Listed Below (If Applicable).  You have been referred to  Dr. Kellie Moor, MD or Dr. [name] - establish care for EP

## 2018-07-22 NOTE — Progress Notes (Signed)
07/22/2018 Amy Hanna   [birth year]  [id number]  Primary Physician Patient, No Pcp Per Primary Cardiologist: Runell Gess MD Roseanne Reno  HPI:  Amy Hanna is a 62 y.o. morbidly overweight married Caucasian female mother of 3 daughters, grandmother 2 grandchildren who is a Engineer, civil (consulting) recently relocated from [place] to [place] in July to be closer to family.  She is self-referred to be established in our practice because of a history of atrial fibrillation.  Her risk factors include treated hypertension, untreated hyperlipidemia and family history the father who died of myocardial infarction age 76.  She is never had a heart attack or stroke.  She denies chest pain or shortness of breath.  She was first diagnosed with A. fib 9 years ago and because of progressively increasing frequency and/or severity of her episodes she underwent unsuccessful A. fib ablation at [facility] 2 years ago.  She did have a heart catheterization at that time that showed normal coronary arteries.  She has a history of diastolic dysfunction.  She still drinks caffeine.  She is on flecainide and Xarelto.  She is had increasing frequency of episodes of PAF.   Current Meds  Medication Sig  . acetaminophen (TYLENOL 8 HOUR) 650 MG CR tablet Take 650 mg by mouth every 8 (eight) hours as needed for pain.  . flecainide (TAMBOCOR) 50 MG tablet Take 50 mg by mouth 2 (two) times daily.  . furosemide (LASIX) 40 MG tablet Take 40 mg by mouth daily.  Marland Kitchen loratadine (CLARITIN) 10 MG tablet Take 10 mg by mouth daily.  Marland Kitchen losartan (COZAAR) 25 MG tablet Take 25 mg by mouth daily.  . metoprolol succinate (TOPROL-XL) 25 MG 24 hr tablet Take 25 mg by mouth 2 (two) times daily.  Marland Kitchen METOPROLOL TARTRATE PO Take 25 mg by mouth as needed.  [name] 20 MG TABS tablet Take 20 mg by mouth every evening.     Allergies  Allergen Reactions  . Atropine   . Ace Inhibitors Cough    Social  History   Socioeconomic History  . Marital status: Married    Spouse name: Not on file  . Number of children: Not on file  . Years of education: Not on file  . Highest education level: Not on file  Occupational History  . Not on file  Social Needs  . Financial resource strain: Not on file  . Food insecurity:    Worry: Not on file    Inability: Not on file  . Transportation needs:    Medical: Not on file    Non-medical: Not on file  Tobacco Use  . Smoking status: Former Games developer  . Smokeless tobacco: Never Used  Substance and Sexual Activity  . Alcohol use: Not on file  . Drug use: Not on file  . Sexual activity: Not on file  Lifestyle  . Physical activity:    Days per week: Not on file    Minutes per session: Not on file  . Stress: Not on file  Relationships  . Social connections:    Talks on phone: Not on file    Gets together: Not on file    Attends religious service: Not on file    Active member of club or organization: Not on file    Attends meetings of clubs or organizations: Not on file    Relationship status: Not on file  . Intimate partner violence:    Fear of current  or ex partner: Not on file    Emotionally abused: Not on file    Physically abused: Not on file    Forced sexual activity: Not on file  Other Topics Concern  . Not on file  Social History Narrative  . Not on file     Review of Systems: General: negative for chills, fever, night sweats or weight changes.  Cardiovascular: negative for chest pain, dyspnea on exertion, edema, orthopnea, palpitations, paroxysmal nocturnal dyspnea or shortness of breath Dermatological: negative for rash Respiratory: negative for cough or wheezing Urologic: negative for hematuria Abdominal: negative for nausea, vomiting, diarrhea, bright red blood per rectum, melena, or hematemesis Neurologic: negative for visual changes, syncope, or dizziness All other systems reviewed and are otherwise negative except as noted  above.    Blood pressure 124/86, pulse (!) 55, height 5\' 4"  (1.626 m), weight (!) 311 lb (141.1 kg).  General appearance: alert and no distress Neck: no adenopathy, no carotid bruit, no JVD, supple, symmetrical, trachea midline and thyroid not enlarged, symmetric, no tenderness/mass/nodules Lungs: clear to auscultation bilaterally Heart: regular rate and rhythm, S1, S2 normal, no murmur, click, rub or gallop Extremities: extremities normal, atraumatic, no cyanosis or edema Pulses: 2+ and symmetric Skin: Skin color, texture, turgor normal. No rashes or lesions Neurologic: Alert and oriented X 3, normal strength and tone. Normal symmetric reflexes. Normal coordination and gait  EKG sinus bradycardia 55 with evidence of LVH by voltage nonspecific ST and T wave changes.  I personally reviewed this EKG.  ASSESSMENT AND PLAN:   Morbid obesity (HCC) History of morbid obesity with BMI 53 attempting to lose weight with diet  Essential hypertension History of essential hypertension her blood pressure measured today at 124/86.  She is on metoprolol and losartan  Paroxysmal atrial fibrillation (HCC) History of PAF on Xarelto and flecainide.  She has had an unsuccessful A. fib ablation at [facility] in [place] 2 years ago.  We talked about avoiding caffeine.  Obstructive sleep apnea History of obstructive sleep apnea on CPAP      Runell Gess MD [facility] 07/22/2018 2:19 PM

## 2018-07-25 LAB — HEPATIC FUNCTION PANEL
ALT: 32 IU/L (ref 0–32)
AST: 33 IU/L (ref 0–40)
Albumin: 4 g/dL (ref 3.6–4.8)
Alkaline Phosphatase: 122 IU/L — ABNORMAL HIGH (ref 39–117)
Bilirubin Total: 0.5 mg/dL (ref 0.0–1.2)
Bilirubin, Direct: 0.2 mg/dL (ref 0.00–0.40)
Total Protein: 6.7 g/dL (ref 6.0–8.5)

## 2018-07-25 LAB — LIPID PANEL
CHOLESTEROL TOTAL: 195 mg/dL (ref 100–199)
Chol/HDL Ratio: 3.9 ratio (ref 0.0–4.4)
HDL: 50 mg/dL (ref >39)
LDL Calculated: 129 mg/dL — ABNORMAL HIGH (ref 0–99)
Triglycerides: 79 mg/dL (ref 0–149)
VLDL CHOLESTEROL CAL: 16 mg/dL (ref 5–40)

## 2018-07-28 ENCOUNTER — Encounter: Payer: Self-pay | Admitting: *Deleted

## 2018-07-29 ENCOUNTER — Other Ambulatory Visit: Payer: Self-pay

## 2018-07-29 ENCOUNTER — Ambulatory Visit (HOSPITAL_COMMUNITY): Payer: PRIVATE HEALTH INSURANCE | Attending: Cardiovascular Disease

## 2018-07-29 DIAGNOSIS — I480 Paroxysmal atrial fibrillation: Secondary | ICD-10-CM | POA: Diagnosis present

## 2018-07-29 DIAGNOSIS — I10 Essential (primary) hypertension: Secondary | ICD-10-CM | POA: Diagnosis not present

## 2018-08-08 ENCOUNTER — Encounter: Payer: Self-pay | Admitting: Internal Medicine

## 2018-08-11 ENCOUNTER — Encounter: Payer: Self-pay | Admitting: Internal Medicine

## 2018-08-11 ENCOUNTER — Ambulatory Visit (INDEPENDENT_AMBULATORY_CARE_PROVIDER_SITE_OTHER): Payer: PRIVATE HEALTH INSURANCE | Admitting: Internal Medicine

## 2018-08-11 VITALS — BP 132/86 | HR 57 | Ht 64.0 in | Wt 310.0 lb

## 2018-08-11 DIAGNOSIS — I10 Essential (primary) hypertension: Secondary | ICD-10-CM | POA: Diagnosis not present

## 2018-08-11 DIAGNOSIS — G4733 Obstructive sleep apnea (adult) (pediatric): Secondary | ICD-10-CM

## 2018-08-11 DIAGNOSIS — I480 Paroxysmal atrial fibrillation: Secondary | ICD-10-CM

## 2018-08-11 NOTE — Patient Instructions (Addendum)
Medication Instructions:  Your physician recommends that you continue on your current medications as directed. Please refer to the Current Medication list given to you today.  Labwork: None ordered.  Testing/Procedures: None ordered.  Follow-Up: Your physician wants you to follow-up in: 3 months with Dr. Johney Frame.    Any Other Special Instructions Will Be Listed Below (If Applicable).  If you need a refill on your cardiac medications before your next appointment, please call your pharmacy.    The following are the codes for a loop recorder: Loop implant (procedure):  33285 Loop surveillance codes:  93298 and 45409   Implantable Loop Recorder Placement An implantable loop recorder is a small electronic device that is placed under the skin of your chest. It is about the size of an AA ("double A") battery. The device records the electrical activity of your heart over a long period of time. Your health care provider can download these recordings to monitor your heart. You may need an implantable loop recorder if you have periods of abnormal heart activity (arrhythmias) or unexplained fainting (syncope) caused by a heart problem. Tell a health care provider about:  Any allergies you have.  All medicines you are taking, including vitamins, herbs, eye drops, creams, and over-the-counter medicines.  Any problems you or family members have had with anesthetic medicines.  Any blood disorders you have.  Any surgeries you have had.  Any medical conditions you have.  Whether you are pregnant or may be pregnant. What are the risks? Generally, this is a safe procedure. However, as with any procedure, problems may occur, including:  Infection.  Bleeding.  Allergic reactions to anesthetic medicines.  Damage to nerves or blood vessels.  Failure of the device to work. This could require another surgery to replace it.  What happens before the procedure?   You may have a physical  exam, blood tests, and imaging tests of your heart, such as a chest X-ray.  Follow instructions from your health care provider about eating or drinking restrictions.  Ask your health care provider about: ? Changing or stopping your regular medicines. This is especially important if you are taking diabetes medicines or blood thinners. ? Taking medicines such as aspirin and ibuprofen. These medicines can thin your blood. Do not take these medicines before your procedure if your surgeon instructs you not to.  Ask your health care provider how your surgical site will be marked or identified.  You may be given antibiotic medicine to help prevent infection.  Plan to have someone take you home after the procedure.  If you will be going home right after the procedure, plan to have someone with you for 24 hours.  Do not use any tobacco products, such as cigarettes, chewing tobacco, and e-cigarettes as told by your surgeon. If you need help quitting, ask your health care provider. What happens during the procedure?  To reduce your risk of infection: ? Your health care team will wash or sanitize their hands. ? Your skin will be washed with soap.  An IV tube will be inserted into one of your veins.  You may be given an antibiotic medicine through the IV tube.  You may be given one or more of the following: ? A medicine to help you relax (sedative). ? A medicine to numb the area (local anesthetic).  A small cut (incision) will be made on the left side of your upper chest.  A pocket will be created under your skin.  The device  will be placed in the pocket.  The incision will be closed with stitches (sutures) or adhesive strips.  A bandage (dressing) will be placed over the incision. The procedure may vary among health care providers and hospitals. What happens after the procedure?  Your blood pressure, heart rate, breathing rate, and blood oxygen level will be monitored often until the  medicines you were given have worn off.  You may be able to go home on the day of your surgery. Before going home: ? Your health care provider will program your recorder. ? You will learn how to trigger your device with a handheld activator. ? You will learn how to send recordings to your health care provider. ? You will get an ID card for your device, and you will be told when to use it.  Do not drive for 24 hours if you received a sedative. This information is not intended to replace advice given to you by your health care provider. Make sure you discuss any questions you have with your health care provider. Document Released: 08/29/2015 Document Revised: 02/23/2016 Document Reviewed: 06/22/2015 Elsevier Interactive Patient Education  [employer].

## 2018-08-11 NOTE — Progress Notes (Signed)
Electrophysiology Office Note   Date:  08/11/2018   ID:  Amy Hanna, DOB June 04, 1956, MRN [id number]  PCP:  Patient, No Pcp Per  Cardiologist:  Dr Allyson Sabal Primary Electrophysiologist: Amy Range, MD    CC: afib   History of Present Illness: Amy Hanna is a 62 y.o. female who presents today for electrophysiology evaluation.   She is referred by Dr Allyson Sabal for EP consultation regarding afib.  Dr Hazle Coca note from 07/22/18 is reviewed.  The patient has HTN, HL, and afib.  She reports initially being diagnosed with atrial fibrillation 9 years ago after presenting with palpitations.  She had AF ablation at [facility] 2017.  Unfortunately, she continues to have afib.  She has been treated with flecainide with increase frequency and duration of episodes.  She has symptoms of palpitations and fatigue.  She is unaware of triggers/ precipitants.  Today, she denies symptoms of chest pain, shortness of breath, orthopnea, PND, lower extremity edema, claudication, dizziness, presyncope, syncope, bleeding, or neurologic sequela. The patient is tolerating medications without difficulties and is otherwise without complaint today.    Past Medical History:  Diagnosis Date  . Atrial fibrillation (HCC)   . History of diastolic dysfunction   . Hyperlipidemia   . Hypertension   . Obesity, morbid (HCC)   . Obstructive sleep apnea   . Sinus bradycardia    Past Surgical History:  Procedure Laterality Date  . ATRIAL FIBRILLATION ABLATION     [facility]  . CARDIAC CATHETERIZATION     normal coronary arteries     Current Outpatient Medications  Medication Sig Dispense Refill  . acetaminophen (TYLENOL 8 HOUR) 650 MG CR tablet Take 650 mg by mouth every 8 (eight) hours as needed for pain.    . flecainide (TAMBOCOR) 50 MG tablet Take 50 mg by mouth 2 (two) times daily.  0  . furosemide (LASIX) 40 MG tablet Take 40 mg by mouth daily.  1  . loratadine (CLARITIN) 10 MG  tablet Take 10 mg by mouth daily.    Marland Kitchen losartan (COZAAR) 25 MG tablet Take 25 mg by mouth daily.  4  . metoprolol succinate (TOPROL-XL) 25 MG 24 hr tablet Take 25 mg by mouth 2 (two) times daily.  3  . METOPROLOL TARTRATE PO Take 25 mg by mouth as needed.    Amy Hanna 20 MG TABS tablet Take 20 mg by mouth every evening.  3   No current facility-administered medications for this visit.     Allergies:   Atropine and Ace inhibitors   Social History:  The patient  reports that she has quit smoking. She has never used smokeless tobacco. She reports that she does not drink alcohol or use drugs.   Family History:  The patient's  family history includes Cancer in her mother; Heart attack in her father; Heart disease in her father and mother; Hypertension in her father.    ROS:  Please see the history of present illness.   All other systems are personally reviewed and negative.    PHYSICAL EXAM: VS:  BP 132/86   Pulse (!) 57   Ht 5\' 4"  (1.626 m)   Wt (!) 310 lb (140.6 kg)   SpO2 98%   BMI 53.21 kg/m  , BMI Body mass index is 53.21 kg/m. GEN: Well nourished, well developed, in no acute distress  HEENT: normal  Neck: no JVD, carotid bruits, or masses Cardiac: RRR; no murmurs, rubs, or gallops,no edema  Respiratory:  clear to auscultation bilaterally, normal work of breathing GI: soft, nontender, nondistended, + BS MS: no deformity or atrophy  Skin: warm and dry  Neuro:  Strength and sensation are intact Psych: euthymic mood, full affect  EKG:  EKG is ordered today. The ekg ordered today is personally reviewed and shows sinus rhythm 57 bpm, PR 178 msec, QRS 102 msec, QTc 461 msec    Lipid Panel     Component Value Date/Time   CHOL 195 07/25/2018 1003   TRIG 79 07/25/2018 1003   HDL 50 07/25/2018 1003   CHOLHDL 3.9 07/25/2018 1003   LDLCALC 129 (H) 07/25/2018 1003   personally reviewed   Wt Readings from Last 3 Encounters:  08/11/18 (!) 310 lb (140.6 kg)  07/22/18 (!) 311  lb (141.1 kg)    Other studies personally reviewed: Additional studies/ records that were reviewed today include: Dr Hazle Coca notes  Review of the above records today demonstrates: as above  Echo 07/29/18 reveals EF 60%, mild to moderate LA enlargement, mild RA enlargement   ASSESSMENT AND PLAN:  1.  Paroxysmal atrial fibrillation Chads2vasc score is 2.  she is anticoagulated with xarelto. Therapeutic strategies for afib including medicine and ablation were discussed in detail with the patient today.  At this time, I would like to better understand her prior ablation.  She states that they "were not able to complete the procedure because I went into afib".  She also says that the procedure was considered "unsuccessful" initially afterwards. I have requested operative note and all follow notes. We also discussed ILR to further characterize her symptoms.  She would like to consider this but will look into costs/ insurance and call me if she decides to proceed.  No changes are made today  We discussed ARREST AF and the importance of lifestyle modification in order maintain sinus rhythm long term.  2. OSA Compliance with CPAP encouraged  3. Morbid obesity Body mass index is 53.21 kg/m. Lifestyle modification encouraged  4. HTN Stable No change required today  Follow-up:  3 months  Current medicines are reviewed at length with the patient today.   The patient does not have concerns regarding her medicines.  The following changes were made today:  none  Labs/ tests ordered today include:  Orders Placed This Encounter  Procedures  . EKG 12-Lead   Signed, Amy Range, MD  08/11/2018 3:50 PM     [facility] [address] [place] [address] [phone] (office) [phone] (fax)

## 2018-08-12 ENCOUNTER — Ambulatory Visit: Admitting: Cardiovascular Disease

## 2018-08-12 NOTE — Progress Notes (Signed)
* * *        **  Emma [name]    --- ---    63 Y old Female, DOB: 09/12/1956    [address] [place], [place] [address]    Home: [phone]    Provider: [name], MD        * * *    Telephone Encounter    ---    Answered by   Deno Lunger  Date: 08/12/2018         Time: 09:06 AM    Reason   no show    --- ---            Message                      09/30                 Action Taken                      [name],[name]  08/14/2018 11:28:18 AM > error                     * * *                ---          * * *          Patient: [name] DOB: 1956-03-22 Provider: [name],  MD 08/12/2018    ---    Note generated by eClinicalWorks EMR/PM Software ([email or web address])

## 2018-08-13 ENCOUNTER — Telehealth: Payer: Self-pay

## 2018-08-13 NOTE — Telephone Encounter (Signed)
-----   Message from Runell GessJonathan J Berry, MD sent at 08/03/2018  4:27 PM EST ----- Nl 2D with mod LA dilatation

## 2018-08-13 NOTE — Telephone Encounter (Signed)
Called to give pt echo results.  Unable to lmom. Pt voicemail is full.

## 2019-01-20 ENCOUNTER — Telehealth: Payer: Self-pay | Admitting: Internal Medicine

## 2019-01-20 NOTE — Telephone Encounter (Signed)
New message   [facility] for pt to call back and schedule past due recall appt with  Dr. Johney Frame. Mychart video.

## 2019-01-21 NOTE — Telephone Encounter (Signed)
Pt returned this office call and stated she is no longer able to use her Insurance at this office has to go to [employer]. She has there BCBS.   Stated thank you!

## 2019-02-23 DIAGNOSIS — R001 Bradycardia, unspecified: Secondary | ICD-10-CM

## 2019-02-23 DIAGNOSIS — I82432 Acute embolism and thrombosis of left popliteal vein: Secondary | ICD-10-CM

## 2019-02-23 HISTORY — DX: Acute embolism and thrombosis of left popliteal vein: I82.432

## 2019-02-23 HISTORY — DX: Bradycardia, unspecified: R00.1

## 2019-06-06 DIAGNOSIS — Z86718 Personal history of other venous thrombosis and embolism: Secondary | ICD-10-CM

## 2019-06-12 DIAGNOSIS — Z7901 Long term (current) use of anticoagulants: Secondary | ICD-10-CM | POA: Insufficient documentation

## 2019-06-12 DIAGNOSIS — I5032 Chronic diastolic (congestive) heart failure: Secondary | ICD-10-CM | POA: Diagnosis present

## 2019-06-30 ENCOUNTER — Emergency Department (HOSPITAL_COMMUNITY)
Admission: EM | Admit: 2019-06-30 | Discharge: 2019-06-30 | Disposition: A | Discharge status: Other Acute Inpatient Hospital | Payer: BLUE CROSS/BLUE SHIELD | Attending: Emergency Medicine | Admitting: Emergency Medicine

## 2019-06-30 ENCOUNTER — Other Ambulatory Visit: Payer: Self-pay

## 2019-06-30 ENCOUNTER — Encounter (HOSPITAL_COMMUNITY): Payer: Self-pay | Admitting: Emergency Medicine

## 2019-06-30 ENCOUNTER — Emergency Department (HOSPITAL_COMMUNITY): Payer: BLUE CROSS/BLUE SHIELD

## 2019-06-30 DIAGNOSIS — I472 Ventricular tachycardia: Secondary | ICD-10-CM | POA: Insufficient documentation

## 2019-06-30 DIAGNOSIS — Z79899 Other long term (current) drug therapy: Secondary | ICD-10-CM | POA: Insufficient documentation

## 2019-06-30 DIAGNOSIS — I10 Essential (primary) hypertension: Secondary | ICD-10-CM | POA: Diagnosis not present

## 2019-06-30 DIAGNOSIS — Z20828 Contact with and (suspected) exposure to other viral communicable diseases: Secondary | ICD-10-CM | POA: Insufficient documentation

## 2019-06-30 DIAGNOSIS — R079 Chest pain, unspecified: Secondary | ICD-10-CM | POA: Diagnosis present

## 2019-06-30 DIAGNOSIS — Z7901 Long term (current) use of anticoagulants: Secondary | ICD-10-CM | POA: Diagnosis not present

## 2019-06-30 DIAGNOSIS — R000 Tachycardia, unspecified: Secondary | ICD-10-CM

## 2019-06-30 LAB — BASIC METABOLIC PANEL
Anion gap: 11 (ref 5–15)
BUN: 16 mg/dL (ref 8–23)
CO2: 24 mmol/L (ref 22–32)
Calcium: 9.6 mg/dL (ref 8.9–10.3)
Chloride: 103 mmol/L (ref 98–111)
Creatinine, Ser: 0.74 mg/dL (ref 0.44–1.00)
GFR calc Af Amer: >60 mL/min (ref >60)
GFR calc non Af Amer: >60 mL/min (ref >60)
Glucose, Bld: 126 mg/dL — ABNORMAL HIGH (ref 70–99)
Potassium: 3.7 mmol/L (ref 3.5–5.1)
Sodium: 138 mmol/L (ref 135–145)

## 2019-06-30 LAB — CBC
HCT: 41.4 % (ref 36.0–46.0)
Hemoglobin: 13.2 g/dL (ref 12.0–15.0)
MCH: 29.7 pg (ref 26.0–34.0)
MCHC: 31.9 g/dL (ref 30.0–36.0)
MCV: 93.2 fL (ref 80.0–100.0)
Platelets: 366 10*3/uL (ref 150–400)
RBC: 4.44 MIL/uL (ref 3.87–5.11)
RDW: 14.9 % (ref 11.5–15.5)
WBC: 7 10*3/uL (ref 4.0–10.5)
nRBC: 0 % (ref 0.0–0.2)

## 2019-06-30 LAB — SARS CORONAVIRUS 2 BY RT PCR (HOSPITAL ORDER, PERFORMED IN ~~LOC~~ HOSPITAL LAB): SARS Coronavirus 2: NEGATIVE

## 2019-06-30 LAB — TROPONIN I (HIGH SENSITIVITY)
Troponin I (High Sensitivity): 6 ng/L (ref <18)
Troponin I (High Sensitivity): 13 ng/L (ref <18)

## 2019-06-30 LAB — MAGNESIUM: Magnesium: 2.2 mg/dL (ref 1.7–2.4)

## 2019-06-30 MED ORDER — ETOMIDATE 2 MG/ML IV SOLN
INTRAVENOUS | Status: AC | PRN
  Administered 2019-06-30: 10 mg via INTRAVENOUS

## 2019-06-30 NOTE — ED Notes (Signed)
Patient transferred to [place] with [facility]. Report given to [employer].

## 2019-06-30 NOTE — ED Notes (Signed)
Cardiology at bedside.

## 2019-06-30 NOTE — Consult Note (Addendum)
H&P   Patient ID: Amy Hanna MRN: [id number]; DOB: 1955/12/08  Admit date: 06/30/2019 Date of Consult: 06/30/2019  Primary Care Provider: Patient, No Pcp Per Primary Electrophysiologist:  Dr. Johney Frame  >>> Dr. Sampson Goon Dr. Grace Bushy: Heme-Onc   Patient Profile:   Amy Hanna is a 63 y.o. female with a hx of HTN, OSA w/CPAP, DVT and paroxysmal Afib with historical AFib ablation [facility] in 2018, who is being seen today for the evaluation of WCR at the request of Dr. Rubin Payor.  History of Present Illness:   Amy Hanna was brought to [facility] via EMS found in [facility] treated by EMS with 150mg  amiodarone, in the ER was found AAO, strong radial pulses, though pale BP charted 129/109, was sedated and cardioverted with 200J to SR  LABS K+ 3.7 Mag 2.2 BUN/Creat 16/0.74 WBC 7.0 H/H 13/41 Plts 366  HS Trop is 6   In review of [facility] record, she was admitted to the hospital from 05/03/2019 to 05/05/2019 due to DVT of the left lower extremity popliteal vein secondary to complicated left Baker's cyst compressing on the vein. Considering she had a DVT while she was on Xarelto, she was switched to therapeutic Lovenox which she had been taking since early August. She also developed a chest wall hematoma and hematology provider was consulted. It was recommended that she start Eliquis.  She last saw her cardiologist Dr. September 06/12/2019 who discussed the above.  Mentioning during her stay had PAF suspect to be 2/2 incorrect CPAP at night.  She was actively and intentionally losing weight, doing well from AFib standpoint post discharge and maintained in her Flecainide and diltiazem tx.  The patient tells me that after her last visit with Dr. 08/12/2019 in Nov, her insurance changed and is now seeing Dr. Dec for EP at [facility].  She is a [employer] and is very knowledgeable on her hx and medicines.  She was changed from metoprolol to diltiazem that did well until Feb of this year her  flecainide and dilt were titrated up with increased AF symptoms,  This apparently this resulted in bradycardia requiring reduction of both again.  Subsequently she has had further tweaking of her medicines with AFib and by her symptoms she feels sinus tach, not AF necessarily.  Most recently she self increased her diltiazem back up to 240mg  daily with sinus tach (by symptoms) though did make her NP/PA aware.  Today she woke with a pressure in the center of her chest felt clammy but no sensation of palpitations, she got up and reported her HR by the O2 sat was 89, she went had a protien bar and tea, feeling improved, though once she got up again started with chest pressure and her HR was 171 and she called EMS.  She did not faint and did not feel like fainting. She  Tells me her AFib symptoms are similar though today's was different in that she had no awareness of palpitations the way she usually does.  She feels a little groggy post sedation though otherwise no symptoms of any kind. VSS  She is afebrile Denies any symptoms of illness, fever, no COVID exposures and has been minding social distance and masking  recommendations  Heart Pathway Score:     Past Medical History:  Diagnosis Date  . History of diastolic dysfunction   . Hyperlipidemia   . Hypertension   . Obesity, morbid (HCC)   . Obstructive sleep apnea    uses CPAP  . Paroxysmal  atrial fibrillation (HCC)   . Sinus bradycardia     Past Surgical History:  Procedure Laterality Date  . ATRIAL FIBRILLATION ABLATION  12/2015   [facility]  . CARDIAC CATHETERIZATION     normal coronary arteries  . CESAREAN SECTION    . REPLACEMENT TOTAL KNEE BILATERAL       Home Medications:  Prior to Admission medications   Medication Sig Start Date End Date Taking? Authorizing Provider  acetaminophen (TYLENOL 8 HOUR) 650 MG CR tablet Take 650 mg by mouth every 8 (eight) hours as needed for pain.    [provider]   flecainide (TAMBOCOR) 50 MG tablet Take 50 mg by mouth 2 (two) times daily. 07/13/18   [provider]  furosemide (LASIX) 40 MG tablet Take 40 mg by mouth daily. 06/24/18   [provider]  loratadine (CLARITIN) 10 MG tablet Take 10 mg by mouth daily.    [provider]  losartan (COZAAR) 25 MG tablet Take 25 mg by mouth daily. 06/09/18   [provider]  metoprolol succinate (TOPROL-XL) 25 MG 24 hr tablet Take 25 mg by mouth 2 (two) times daily. 06/19/18   [provider]  METOPROLOL TARTRATE PO Take 25 mg by mouth as needed.    [provider]  XARELTO 20 MG TABS tablet Take 20 mg by mouth every evening. 07/09/18   [provider]    Inpatient Medications: Scheduled Meds:  Continuous Infusions:  PRN Meds:   Allergies:    Allergies  Allergen Reactions  . Atropine   . Ace Inhibitors Cough    Social History:   Social History   Socioeconomic History  . Marital status: Married    Spouse name: Not on file  . Number of children: Not on file  . Years of education: Not on file  . Highest education level: Not on file  Occupational History  . Not on file  Social Needs  . Financial resource strain: Not on file  . Food insecurity    Worry: Not on file    Inability: Not on file  . Transportation needs    Medical: Not on file    Non-medical: Not on file  Tobacco Use  . Smoking status: Former Games developermoker  . Smokeless tobacco: Never Used  Substance and Sexual Activity  . Alcohol use: Never    Frequency: Never  . Drug use: Never  . Sexual activity: Not on file  Lifestyle  . Physical activity    Days per week: Not on file    Minutes per session: Not on file  . Stress: Not on file  Relationships  . Social Musicianconnections    Talks on phone: Not on file    Gets together: Not on file    Attends religious service: Not on file    Active member of club or organization: Not on file    Attends meetings of clubs or organizations:  Not on file    Relationship status: Not on file  . Intimate partner violence    Fear of current or ex partner: Not on file    Emotionally abused: Not on file    Physically abused: Not on file    Forced sexual activity: Not on file  Other Topics Concern  . Not on file  Social History Narrative   [place]ives in [place], [place] from [place]   unemployed   RN    Family History:   Family History  Problem Relation  Age of Onset  . Cancer Mother   . Heart disease Mother   . Heart attack Father   . Heart disease Father   . Hypertension Father      ROS:  Please see the history of present illness.  All other ROS reviewed and negative.     Physical Exam/Data:   Vitals:   06/30/19 1130 06/30/19 1135 06/30/19 1142  BP: (!) 129/109  136/90  Pulse: (!) 167  77  Resp: 20  12  Temp:  98.6 F (37 C)   TempSrc:  Oral   SpO2: 97%  94%   No intake or output data in the 24 hours ending 06/30/19 1226 Last 3 Weights 08/11/2018 07/22/2018  Weight (lbs) 310 lb 311 lb  Weight (kg) 140.615 kg 141.069 kg     There is no height or weight on file to calculate BMI.  General:  Well nourished, well developed, in no acute distress HEENT: normal Lymph: no adenopathy Neck: no JVD Endocrine:  No thryomegaly Vascular: No carotid bruits  Cardiac:  RRR; no murmurs, gallops or rubs Lungs:  CTA b/l, no wheezing, rhonchi or rales  Abd: soft, nontender, obese Ext: brawnly type edema, no pitting edema mild skin changes (reported to me to be chronic) Musculoskeletal:  No deformities, BUE and BLE strength normal and equal Skin: warm and dry  Neuro:  No focal abnormalities noted Psych:  Normal affect   EKG:  The EKG was personally reviewed and demonstrates:  WCT 164bpm #2 junctional 66bpm #3 is SR69bpm, PR 258ms, QRS 163ms   Telemetry:  Telemetry was personally reviewed and demonstrates:    SR currently   Relevant CV Studies:   07/29/18: TTE Study Conclusions - Left  ventricle: The cavity size was normal. Wall thickness was   increased in a pattern of mild LVH. Systolic function was normal.   The estimated ejection fraction was in the range of 60% to 65%.   Wall motion was normal; there were no regional wall motion   abnormalities. Left ventricular diastolic function parameters   were normal. - Aortic valve: There was trivial regurgitation. - Left atrium: The atrium was mildly to moderately dilated. - Right atrium: The atrium was mildly dilated.  01/08/2017: LHC No significant CAD  Laboratory Data:  High Sensitivity Troponin:  No results for input(s): TROPONINIHS in the last 720 hours.   ChemistryNo results for input(s): NA, K, CL, CO2, GLUCOSE, BUN, CREATININE, CALCIUM, GFRNONAA, GFRAA, ANIONGAP in the last 168 hours.  No results for input(s): PROT, ALBUMIN, AST, ALT, ALKPHOS, BILITOT in the last 168 hours. Hematology Recent Labs  Lab 06/30/19 1148  WBC 7.0  RBC 4.44  HGB 13.2  HCT 41.4  MCV 93.2  MCH 29.7  MCHC 31.9  RDW 14.9  PLT 366   BNPNo results for input(s): BNP, PROBNP in the last 168 hours.  DDimer No results for input(s): DDIMER in the last 168 hours.   Radiology/Studies:   Dg Chest Portable 1 View Result Date: 06/30/2019 CLINICAL DATA:  Chest pain EXAM: PORTABLE CHEST 1 VIEW COMPARISON:  None. FINDINGS: Heart and mediastinal contours are within normal limits. No focal opacities or effusions. No acute bony abnormality. IMPRESSION: No active disease. Electronically Signed   By: Rolm Baptise M.D.   On: 06/30/2019 11:56    Assessment and Plan:    1. WCT likely conducting flutter in the environment of flecainide though can not r/o VT     No CAD by cath in 2018 and no  anginal complaints outside of chest pressure in the tachycardia     HS Trop is 6     Preserved LVEF Oct 2019 by TTE  Dr. Johney Frame has seen the patient Recommend stopping her flecainide and be admitted for observation while flecainide washes out The patient  would very much prefer to be transferred to [place] to be cared for by Dr. Dionisio Paschal team  Dr. Johney Frame has discussed with Dr. Rubin Payor who will try to get this arranged/facilitated    For questions or updates, please contact [facility] Please consult [email or web address] for contact info under     Signed, Sheilah Pigeon, PA-C  06/30/2019 12:26 PM    I have seen, examined the patient, and reviewed the above assessment and plan.  Changes to above are made where necessary.  On exam, RRR.  The patient presents with wide complex tachycardia requiring cardioversion. She is now very stable.  I suspect either 1:1 conducting atrial flutter vs flecainide related VT. I have advised admission for flecainide washout and possible EP study.  She would prefer to have this performed at [facility] where she is followed by Dr Sampson Goon.  I think that this is reasonable to allow for continuity of care.  I have spoken with Dr Rubin Payor who will try to arrange transfer to cardiology/EP teams at [facility]  Co Sign: [name], MD 06/30/2019 8:38 PM

## 2019-06-30 NOTE — ED Notes (Signed)
[facility] called for trransport

## 2019-06-30 NOTE — ED Provider Notes (Addendum)
[facility] EMERGENCY DEPARTMENT Provider Note   CSN: [id number] Arrival date & time: 06/30/19  1125     History   Chief Complaint Chief Complaint  Patient presents with  . Ventricular Tachycardia    HPI Amy Hanna is a 63 y.o. female.     HPI Patient presents with chest pain and some shortness of breath.  History of paroxysmal atrial fibrillation.  Normally not in A. fib however.  Chest pain in the upper chest.  Found by EMS to be in what appeared to be ventricular tachycardia.  Given amiodarone by EMS for heart rate around 200.  Upon arrival heart rate was still around 160-170 and still wide.  Patient is awake and talking over.  May have had mildly decreased blood pressure since some of the measurements were around 109 systolic.  She had been given amiodarone by EMS and is already on flecainide and Eliquis.  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  Has had 2 previous cardiac ablations.  Also had previous cardioversions.  Seen primarily at [facility] but is been seen at [facility] previously. Past Medical History:  Diagnosis Date  . History of diastolic dysfunction   . Hyperlipidemia   . Hypertension   . Obesity, morbid (HCC)   . Obstructive sleep apnea    uses CPAP  . Paroxysmal atrial fibrillation (HCC)   . Sinus bradycardia     Patient Active Problem List   Diagnosis Date Noted  . Essential hypertension 07/22/2018  . Paroxysmal atrial fibrillation (HCC) 07/22/2018  . Morbid obesity (HCC) 07/22/2018  . Obstructive sleep apnea 07/22/2018    Past Surgical History:  Procedure Laterality Date  . ATRIAL FIBRILLATION ABLATION  12/2015   [facility]  . CARDIAC CATHETERIZATION     normal coronary arteries  . CESAREAN SECTION    . REPLACEMENT TOTAL KNEE BILATERAL       OB History   No obstetric history on file.      Home Medications    Prior to Admission medications   Medication Sig Start Date End  Date Taking? Authorizing Provider  acetaminophen (TYLENOL 8 HOUR) 650 MG CR tablet Take 650 mg by mouth every 8 (eight) hours as needed for pain.    [provider]  flecainide (TAMBOCOR) 50 MG tablet Take 50 mg by mouth 2 (two) times daily. 07/13/18   [provider]  furosemide (LASIX) 40 MG tablet Take 40 mg by mouth daily. 06/24/18   [provider]  loratadine (CLARITIN) 10 MG tablet Take 10 mg by mouth daily.    [provider]  losartan (COZAAR) 25 MG tablet Take 25 mg by mouth daily. 06/09/18   [provider]  metoprolol succinate (TOPROL-XL) 25 MG 24 hr tablet Take 25 mg by mouth 2 (two) times daily. 06/19/18   [provider]  METOPROLOL TARTRATE PO Take 25 mg by mouth as needed.    [provider]  XARELTO 20 MG TABS tablet Take 20 mg by mouth every evening. 07/09/18   [provider]    Family History Family History  Problem Relation Age of Onset  . Cancer Mother   . Heart disease Mother   . Heart attack Father   . Heart disease Father   . Hypertension Father     Social History Social History   Tobacco Use  . Smoking status: Former Games developer  . Smokeless tobacco: Never Used  Substance Use Topics  .  Alcohol use: Never    Frequency: Never  . Drug use: Never     Allergies   Atropine, Valsartan, and Ace inhibitors   Review of Systems Review of Systems  Constitutional: Negative for appetite change.  Respiratory: Positive for shortness of breath.   Cardiovascular: Positive for chest pain. Negative for leg swelling.  Gastrointestinal: Negative for abdominal pain.  Genitourinary: Negative for flank pain.  Skin: Negative for rash.  Neurological: Negative for weakness.  Psychiatric/Behavioral: Negative for confusion.     Physical Exam Updated Vital Signs BP 122/87   Pulse 71   Temp 98.6 F (37 C) (Oral)   Resp (!) 22   SpO2 95%   Physical Exam Vitals signs and nursing note reviewed.   Constitutional:      Appearance: She is obese.  HENT:     Head: Normocephalic.  Eyes:     Pupils: Pupils are equal, round, and reactive to light.  Cardiovascular:     Rate and Rhythm: Regular rhythm. Tachycardia present.  Pulmonary:     Effort: Pulmonary effort is normal.     Breath sounds: No wheezing, rhonchi or rales.  Abdominal:     Tenderness: There is no abdominal tenderness.  Musculoskeletal:        General: No tenderness.     Right lower leg: No edema.     Left lower leg: No edema.  Skin:    General: Skin is warm.     Capillary Refill: Capillary refill takes less than 2 seconds.  Neurological:     Mental Status: She is alert and oriented to person, place, and time.      ED Treatments / Results  Labs (all labs ordered are listed, but only abnormal results are displayed) Labs Reviewed  BASIC METABOLIC PANEL - Abnormal; Notable for the following components:      Result Value   Glucose, Bld 126 (*)    All other components within normal limits  SARS CORONAVIRUS 2 (TAT 6-24 HRS)  SARS CORONAVIRUS 2 (HOSPITAL ORDER, PERFORMED IN [facility])  MAGNESIUM  CBC  TROPONIN I (HIGH SENSITIVITY)  TROPONIN I (HIGH SENSITIVITY)    EKG ED ECG REPORT   Date: 06/30/2019 11:29  Rate: 164  Rhythm: ventricular tachycardia  Wide-complex tachycardia.  Appears regular.      EKG Interpretation  Date/Time:  Tuesday June 30 2019 11:38:34 EDT Ventricular Rate:  66 PR Interval:    QRS Duration: 125 QT Interval:  383 QTC Calculation: 402 R Axis:   -68 Text Interpretation:  Junctional rhythm Nonspecific IVCD with LAD LVH with secondary repolarization abnormality Anterior Q waves, possibly due to LVH immediately post cardioversion Confirmed by Benjiman CorePickering, Raymonde Hamblin [phone]([id number]) on 06/30/2019 11:52:03 AM       EKG Interpretation  Date/Time:  Tuesday June 30 2019 11:46:08 EDT Ventricular Rate:  69 PR Interval:    QRS Duration: 122 QT Interval:  439 QTC  Calculation: 471 R Axis:   -45 Text Interpretation:  Sinus rhythm Prolonged PR interval Left bundle branch block Confirmed by Benjiman CorePickering, Norinne Jeane [phone]([id number]) on 06/30/2019 11:55:05 AM         Radiology Dg Chest Portable 1 View  Result Date: 06/30/2019 CLINICAL DATA:  Chest pain EXAM: PORTABLE CHEST 1 VIEW COMPARISON:  None. FINDINGS: Heart and mediastinal contours are within normal limits. No focal opacities or effusions. No acute bony abnormality. IMPRESSION: No active disease. Electronically Signed   By: Charlett NoseKevin  Dover M.D.   On: 06/30/2019 11:56    Procedures .  Sedation  Date/Time: 06/30/2019 1:05 PM Performed by: Davonna Belling, MD Authorized by: Davonna Belling, MD   Consent:    Consent obtained:  Verbal   Consent given by:  Patient   Risks discussed:  Allergic reaction, dysrhythmia, inadequate sedation, nausea, vomiting, respiratory compromise necessitating ventilatory assistance and intubation and prolonged hypoxia resulting in organ damage Universal protocol:    Immediately prior to procedure a time out was called: yes   Indications:    Procedure performed:  Cardioversion Pre-sedation assessment:    Time since last food or drink:  3 hrs   NPO status caution: urgency dictates proceeding with non-ideal NPO status     ASA classification: class 3 - patient with severe systemic disease     Mallampati score:  Unable to assess   Pre-sedation assessments completed and reviewed: airway patency   Immediate pre-procedure details:    Reassessment: Patient reassessed immediately prior to procedure     Reviewed: vital signs, relevant labs/tests and NPO status     Verified: bag valve mask available, emergency equipment available, intubation equipment available, IV patency confirmed and oxygen available   Procedure details (see MAR for exact dosages):    Preoxygenation:  Nasal cannula   Sedation:  Etomidate   Intra-procedure monitoring:  Blood pressure monitoring, continuous capnometry,  continuous pulse oximetry, cardiac monitor, frequent LOC assessments and frequent vital sign checks   Intra-procedure events: none     Total Provider sedation time (minutes):  5 Post-procedure details:    Attendance: Constant attendance by certified staff until patient recovered     Recovery: Patient returned to pre-procedure baseline     Patient is stable for discharge or admission: yes     Patient tolerance:  Tolerated well, no immediate complications  .Cardioversion  Date/Time: 06/30/2019 1:24 PM Performed by: Davonna Belling, MD Authorized by: Davonna Belling, MD   Consent:    Consent obtained:  Verbal   Consent given by:  Patient   Risks discussed:  Cutaneous burn, death, pain and induced arrhythmia   Alternatives discussed:  No treatment, alternative treatment and rate-control medication Pre-procedure details:    Cardioversion basis:  Emergent   Rhythm:  Ventricular tachycardia Patient sedated: Yes. Refer to sedation procedure documentation for details of sedation.  Attempt one:    Cardioversion mode:  Synchronous   Waveform:  Biphasic   Shock (Joules):  200   Shock outcome:  Conversion to normal sinus rhythm Post-procedure details:    Patient status:  Awake   Patient tolerance of procedure:  Tolerated well, no immediate complications   (including critical care time)  Medications Ordered in ED Medications  etomidate (AMIDATE) injection (10 mg Intravenous Given 06/30/19 1137)     Initial Impression / Assessment and Plan / ED Course  I have reviewed the triage vital signs and the nursing notes.  Pertinent labs & imaging results that were available during my care of the patient were reviewed by me and considered in my medical decision making (see chart for details).        Patient with wide complex tachycardia.  Ventricular tachycardia versus atrial flutter/fib with aberrancy.  Cardioverted and back in sinus rhythm.  Patient is on flecainide.  Seen by  electrophysiology here.  Request transfer to [facility] where she can see her own electrophysiologist, Dr. Ola Spurr.  I discussed with Dr. Maple Hudson at [facility].  Accepted in transfer.  CRITICAL CARE Performed by: Davonna Belling Total critical care time: 30 minutes Critical care time was  exclusive of separately billable procedures and treating other patients. Critical care was necessary to treat or prevent imminent or life-threatening deterioration. Critical care was time spent personally by me on the following activities: development of treatment plan with patient and/or surrogate as well as nursing, discussions with consultants, evaluation of patient's response to treatment, examination of patient, obtaining history from patient or surrogate, ordering and performing treatments and interventions, ordering and review of laboratory studies, ordering and review of radiographic studies, pulse oximetry and re-evaluation of patient's condition.  Final Clinical Impressions(s) / ED Diagnoses   Final diagnoses:  Wide-complex tachycardia [facility])    ED Discharge Orders    None       Benjiman Core, MD 06/30/19 1410    Benjiman Core, MD 07/10/19 [phone]

## 2019-06-30 NOTE — ED Triage Notes (Signed)
Pt arrives to ED from home via [facility] with complaints of centralized chest pain starting this morning. On EMS arrival patient was found to be in Ventricular Tachycardia with heart rate up to 200bpm. EMS gave patient 150m amiodarone and transferred patient to Trauma B. Patient was met by MD PAlvino Chapeland this RN. Patient was alert and oriented x3, pale, has strong radial pulses, and chest pain 4/10. Patient was given 178metomidate and cardioverted with 200J. Patient now in sinus rhythm with heart rate in 60.

## 2019-06-30 NOTE — Code Documentation (Signed)
Patient was cardioverted with 200J shock by MD Pickering for VT rhythm.

## 2019-06-30 NOTE — ED Notes (Signed)
Report given to [facility] charge nurse and notified of patient transfer.

## 2019-10-17 IMAGING — DX DG CHEST 1V PORT
1 series · 1 of 1 positions shown · non-contrast
Comparison: None.

CLINICAL DATA: Chest pain

EXAM:
PORTABLE CHEST 1 VIEW

[chest]
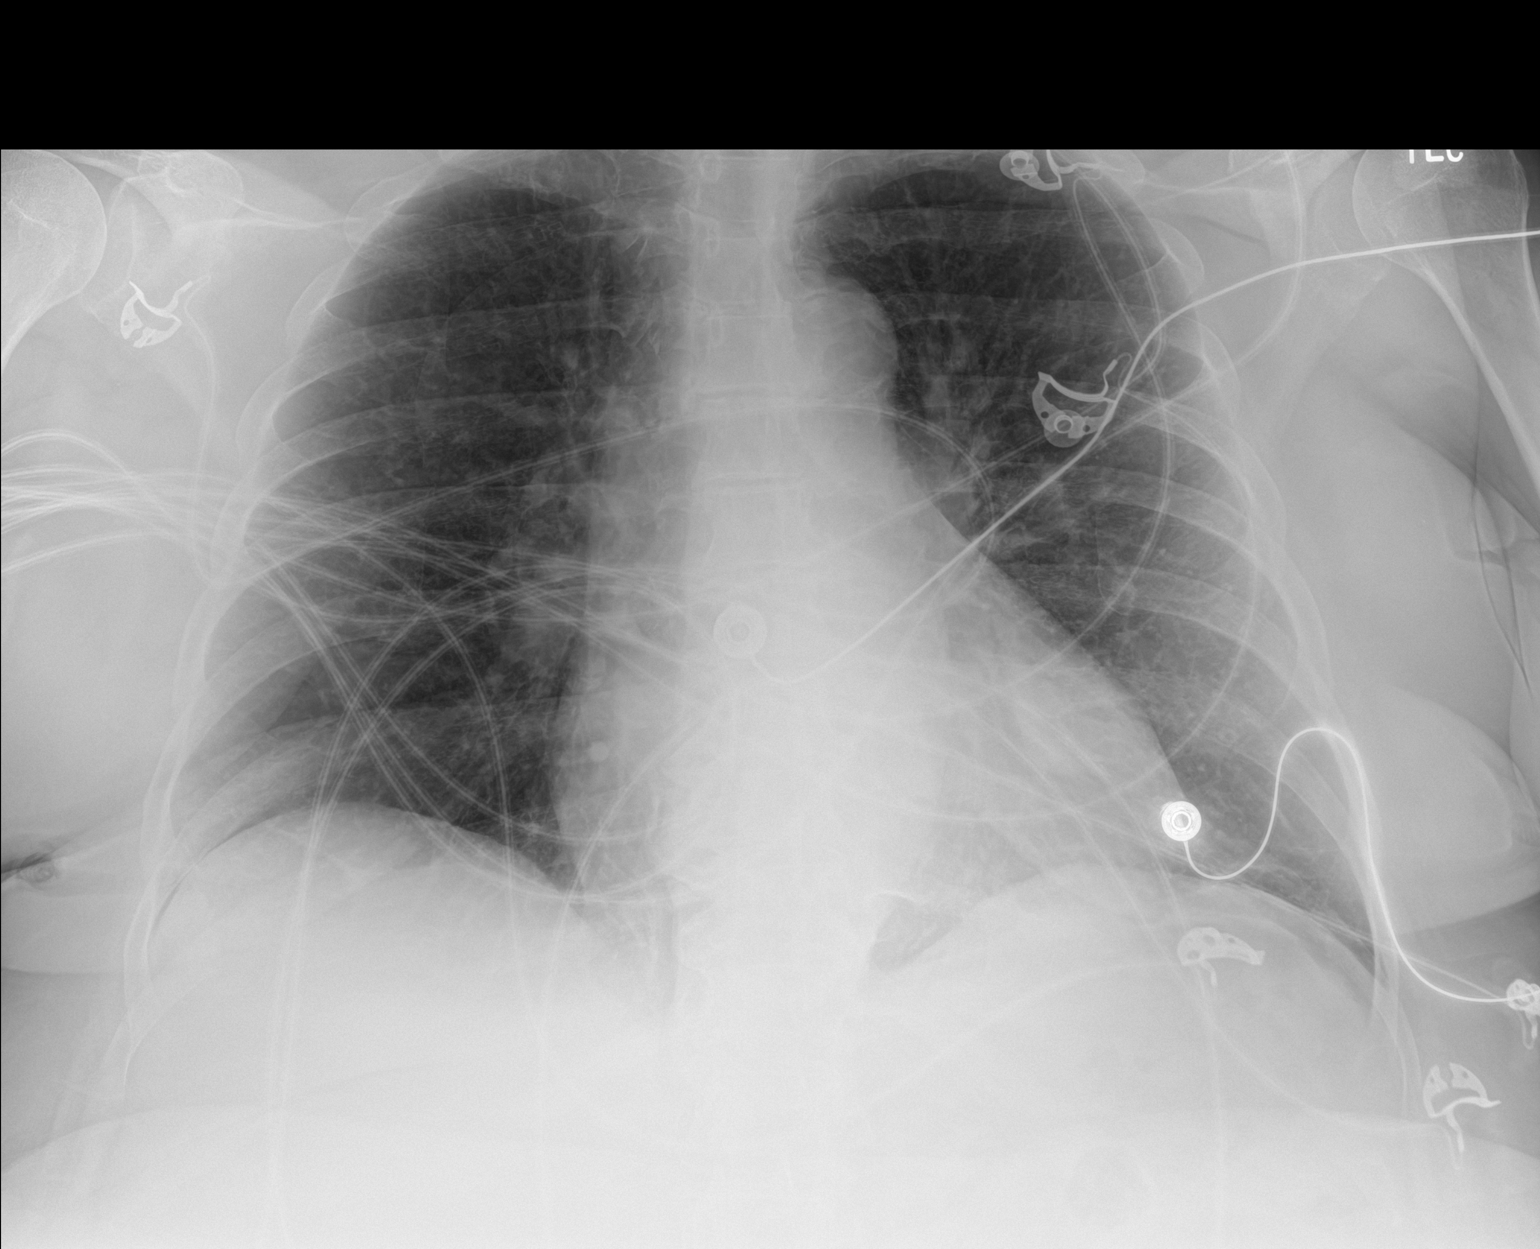

[1 of 1 positions shown; findings below may reference images not displayed]

FINDINGS: Heart and mediastinal contours are within normal limits. No focal
opacities or effusions. No acute bony abnormality.
IMPRESSION: No active disease.

## 2020-02-01 DIAGNOSIS — R001 Bradycardia, unspecified: Secondary | ICD-10-CM | POA: Diagnosis present

## 2020-12-27 NOTE — Progress Notes (Signed)
* * *         [facility]**        ---    Lawernce Ion. Sindy Messing, MD [facility] Kieth Brightly. Donnetta Simpers, MD [facility] Barbera Setters Byrd Hesselbach, MD  [facility];    Darlyn Chamber, MD [facility] Roosvelt Maser, MD [facility] Shanon Brow, MD [facility]  Nile Dear. Audley Hose, MD [facility]    Kandis Mannan A. Karie Mainland, MD [facility] Johnell Comings. MacNaught, MD [facility] Mitchel Honour, MD Erma Pinto, MD [facility]    Colletta Maryland, MD Weston Anna, MD [facility] Kerby Moors, NP Maximino Greenland ,  NP    Tonna Corner, NP Danelle Earthly, NP        * * *     **Patient Name:** Emma [name]   **Date:** 12/11/2017    --- ---     **DOB:** [birth year]     **Referring Provider:** Mardene Sayer   **Appointment Provider:** Tonna Corner, NP        * * *    12/11/2017  Progress Notes: Tonna Corner, NP    --- ---    ---        Current Medications    ---    Taking     * furosemide 40 mg tablet 1 tab(s) orally B.I.D.    ---    * Metoprolol Tartrate 25 mg tablet 1 tab(s) orally B.I.D.    ---    * loratadine 10 mg tablet 1 tab(s) orally once a day    ---    * Tylenol Extra Strength 500 mg tablet 2 tab(s) orally PRN    ---    * flecainide 50 mg tablet 1 tab(s) orally B.I.D.    ---    * Medication List reviewed and reconciled with the patient    ---      Past Medical History    ---      PAF - PVI procedure at [facility] 01/18/17 apparently unsuccessful, flecainide  doubled to 100 bid - postop complicated by pericarditis on colchicine    ---    HTN    ---    Obesity    ---    Osteoarthritis    ---    Spinal stenosis    ---    Fasle positive stress test with normal coronaries on cath 12/2016    ---      Surgical History    ---      C section    ---    rhinoplasty    ---    knee replacement    ---      Family History    ---      Father: deceased 45 yrs, CAD, MI, AAA    ---    Mother: deceased 42 yrs, Died of CA, initially lymphoma then sarcomas in leg  with mets    ---    Siblings: sister- HTN, CAD    ---      Social History    ---    Tobacco Discontinued: 1980s, Status: former smoker,  How long has it been since  you last smoked? > 10 years, Patient counseled on the dangers of tobacco use:  12/11/2017.    Advised to lose weight: Yes Discussed elevated BMI with the patient and  importance of losing weight for cardiovascular and general overall health,  Discussed elevated BMI with the patient and importance of losing weight for  cardiovascular and general overall health.    No Have  you fallen in the past year?.    no ETOH.    no Recreational drug use.    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    ---      Allergies    ---      ace inhibitors    ---      Review of Systems    ---     _._ :    no Change in Vision. no Major change in weight. no Nausea. no vomitting. no  Diarrhea. no hematemesis. no Blood in urine. no blood in stool. no black,  tarry stool. no focal weakness, numbness or tingling. no rashes. no  hallucinations. no fevers, chills. no decrease in appetite. no Intrascapular  pain. no Bruising. no abdominal pain.            Reason for Appointment    ---      1\. Follow up; pAF, HTN, Diastolic heart failure    ---      History of Present Illness    ---     _History of Present Illness_ :    65 year old with a history of PAF, hypertension, and obesity, who presents for  followup. She was last seen in our office in September, 2018. AT that time she  was stable from cardiac perspectives. Since her last office visit, she reports  being admitted in December, 2018 for diverticular bleed. GI recommended  holding Xarelto. Unfortunately, patient was unable to follow up in our office,  she is in the process of moving.    She states overall, she feels well. She missed a dose of Metoprolol the other  week and had a brief episode of AFib and palpitations. She took an additional  tablet and symptoms resolved. Formulation of her Metoprolol was changed from  succinate to tartrate, unclear reasoning why as she was stable on Toprol XL 25  mg BID. She denies and recurrent bleeding. No  palpitations since recent  episode. No chest pain, pressure, SOB, DOE, dizziness, near syncope.      Vital Signs    ---    HR 57, BP 142/80, Wt 325, Ht 65, BMI 54.08, Repeat BP 140/88, Med Assist: rm.      Examination    ---     _HS_ :    General: pleasant, obese, middle aged female, in no distress. ENMT EOMI,  anicteric, MMM, OP clear. Neck: no JVD, bruits, or lymphadenopathy. Pulmonary  System clear to auscultation bilaterally, no wheezes or crackles. Cardiac: PMI  is nondisplaced, normal S1 S2, regular rate, no murmur, gallop or pericardial  rub. GI SYSTEM obese, soft, NT/ND, BS present, no guarding or rigidity.  Extremities no clubbing, no edema, + lymphedema. Peripheral pulses: normal  (2+) bilaterally. Skin: normal, no rash. Neurologic exam: grossly non-focal.  Pyschiatric appropriate.          Data    ---     _EKG (reviewed personally)_ :    06/10/17: sinus rhythm, 50, borderline IVCD, QTC .463.    _Lipids_ :    4/16: TC 214 TRI 103 HDL 60 LDL 133. 3/15: TC 214, TG 68, HDL 58, LDL 142.    _Lexiscan Myoview_ :    2012: normal.          Impression/Recommendations    ---       **1\. Paroxysmal atrial fibrillation**    Continue Metoprolol Tartrate tablet, 25 mg, 1 tab(s), orally, once daily as  needed  Start Toprol-XL tablet, extended release, 25 mg, 1 tab(s), orally, B.I.D., 90  days, 180, Refills 3    Start Xarelto tablet, 20 mg, 1 tab(s), orally, once a day (in the evening), 90  days, 90 tablets, Refills 3    Clinical Notes: Xarelto was held by GI, never resumed. CHADSVASC- 2 (Female,  HTN), anticoagulation recommended. We will resume Xarelto 20 mg daily,  emphasized taking medication on full stomach for proper absorption. We will  change her back to Toprol XL 25 mg BID which controlled her HR and  palpitations better than tartrate, she can use tartrate for any episodes of  palps. She also was admitted to [facility] after her diverticular bleed for  bothersome palps, echo was performed, we will obtain  records to review.    ---        **2\. Hypercholesterolemia**    Notes: Patient Educated with: [email or web address].pdf ([email or web address].pdf).    Clinical Notes: No recent labs to review. PCP following, no statin at this  time. Lifestyle and dietary modifications. Patient has lost almost 10 pounds,  encouraged her to continue in her weight loss efforts.        **3\. Essential (primary) hypertension**    Clinical Notes: Blood pressure moderately elevated. Reinforced low-sodium  diet, continue furosemide and Toprol-XL. Instructed to monitor her blood  pressures over the next one to 2 weeks and to call our office if systolic  blood pressures remain over 140 mmHg.      Preventive Medicine    ---      Counseling: Lifestyle therapeutic issues . Diet . Exercise .    ---      Follow Up    ---    6 Months    Electronically signed by Homero Fellers MD on 12/16/2017 at 12:02 AM EDT    Sign off status: Completed        * * *        [facility]    [address]    [place], [place] [address]    Tel: [phone]    Fax: [phone]              * * *          Patient: JEMMA, RASP DOB: [birth year] Progress Note: Tonna Corner, NP  12/11/2017    ---    Note generated by eClinicalWorks EMR/PM Software ([email or web address])

## 2020-12-27 NOTE — Progress Notes (Signed)
* * *         [facility], [facility]**        ---    Lawernce Ion. Sindy Messing, MD [facility] Kieth Brightly. Donnetta Simpers, MD [facility] Barbera Setters Byrd Hesselbach, MD  [facility];    Darlyn Chamber, MD [facility] Roosvelt Maser, MD [facility] Shanon Brow, MD [facility]  Nile Dear. Audley Hose, MD [facility]    Kandis Mannan A. Karie Mainland, MD [facility] Johnell Comings. MacNaught, MD [facility] Mitchel Honour, MD Erma Pinto, MD [facility]    Colletta Maryland, MD Weston Anna, MD [facility] Kerby Moors, NP Maximino Greenland ,  NP        * * *     **Patient Name:** Emma [name]   **Date:** 03/29/2016    --- ---     **DOB:** 1956-05-07     **Referring Provider:** Mardene Sayer   **Appointment Provider:** Danelle Earthly, ACNP        * * *    03/29/2016  Progress Notes: Danelle Earthly, ACNP    --- ---    ---        Current Medications    ---    Taking     * loratadine 10 mg tablet 1 tab(s) orally once a day    ---    * losartan 50 mg tablet 1 tab(s) orally BID    ---    * Xarelto 20 mg tablet TAKE ONE TABLET BY MOUTH IN THE EVENING orally Q.D.    ---    * Tylenol Extra Strength 500 mg tablet 2 tab(s) orally prn    ---    * Metoprolol Succinate ER 25 mg tablet, extended release 1 tab(s) orally bid    ---    * flecainide 50 mg tablet 1 tab(s) orally bid    ---    * hydrochlorothiazide 25 mg tablet 1 tab(s) orally once a day    ---    * Medication List reviewed and reconciled with the patient    ---      Past Medical History    ---      PAF    ---    HTN    ---    Obesity    ---    Osteoarthritis    ---    Spinal stenosis    ---      Surgical History    ---      C section    ---    rhinoplasty    ---    knee replacement    ---      Family History    ---      Father: deceased 1 yrs, CAD, MI, AAA    ---    Mother: deceased 29 yrs, Died of CA, initially lymphoma then sarcomas in leg  with mets    ---    Siblings: sister- HTN, CAD    ---      Social History    ---    no Smoking    Discontinued: _1980s_    Status: _former smoker_    How long has it been since you last smoked? _> 10 years_    Patient  counseled on the dangers of tobacco use: _06/29/2017_    Advised to lose weight: Yes Discussed elevated BMI with the patient and  importance of losing weight for cardiovascular and general overall health.    No Presence of Falls .    no ETOH.    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    ---      Allergies    ---      ace inhibitors    ---      Review of Systems    ---     _._ :    no Change in Vision. no Major change in weight. no Nausea. no vomitting. no  Diarrhea. no hematemesis. no Blood in urine. no blood in stool. no black,  tarry stool. no focal weakness, numbness or tingling. no rashes. no  hallucinations. no fevers, chills. no decrease in appetite. no Intrascapular  pain. no Bruising. no abdominal pain.    _Cardiology_ :    no chest pain. no palpitations. no leg edema. no dizziness. no shortness of  breath. no Orthopnea. no PND. no claudication. no syncope. no dyspnea with  exertion. no exercise intolerance.            Reason for Appointment    ---      1\. Palpitations and recurrent pAF    ---      History of Present Illness    ---     _HPI_ :    65 year old with a history of PAF, hypertension, and obesity who is seen today  per her request for evaluation of 2 episodes of palpitations. Per her report  was admitted to [facility] in rapid AF in April of this year  and required dilt gtt. Converted after about 5 hrs and was sent home on her  usual Flecainide and Metoprolol doses. She did well until about 5 days ago  when she developed palps with a HR of 120 bpm. She was due for her next dose  of Flecainide and Metoprolol and after taking her meds she went to bed and  woke up the next day in SR. She remains on Xarelto and is fully compliant. Had  been having issue with Cpap machine at home but a week ago got a new mask and  has been using it nightly without issues. She continues to work nights, has  gained weight, and remains relatively inactive.    Denies chest pain or  dyspnea nausea vomiting diaphoresis PND orthopnea  lightheadedness palpitations or syncope. Has chronic, dependent LE edema  unchanged.      Vital Signs    ---    HR 56, BP 132/96, Wt 330 (+12), Ht 65, BMI 54.91, Repeat BP 136/94, Med  Assist: AN.      Examination    ---     _HS_ :    General: pleasant, obese, middle aged female, in no distress.    ENMT EOMI, anicteric, MMM, OP clear.    Neck: no JVD, bruits, or lymphadenopathy.    Pulmonary System clear to auscultation bilaterally, no wheezes or crackles.    Cardiac: PMI is nondisplaced, normal S1 S2, regular rate, no murmur, gallop or  pericardial rub.    GI SYSTEM obese, soft, NT/ND, BS present, no guarding or rigidity.    Extremities no clubbing, no edema, + lymphedema.    Peripheral pulses: normal (2+) bilaterally.    Skin: normal, no rash.    Neurologic exam: grossly non-focal.    Pyschiatric appropriate.          Data    ---     _EKG (reviewed personally)_ :    03/16/15:Sinus rhythm, 62.    _Lipids_ :    4/16: TC 214 TRI 103 HDL 60  LDL 133.    3/15: TC 214, TG 68, HDL 58, LDL 142.    _Labs_ :    3/15: Na 140, K 4, BUN 15, Cr 0.6, Hgb 14 .    _Lexiscan Myoview_ :    2012: normal.          Impression/Recommendations    ---       **1\. Paroxysmal atrial fibrillation**    Start Metoprolol Tartrate tablet, 25 mg, 1 tab(s), orally, orally once daily  as needed for episodes of atrial fibrillation, 30 day(s), 30, Refills 0    _IMAGING: **EKG_   Results reviewed by Physician  results reviewed by  physician     --- --- ---        Clinical Notes: Intermittent, pAF with generally rare episodes although 2  episodes in last 6 mo. She is very symptomatic with palpitations and HR can  get up to 160 - 170 bpm in AF. Has always been self limited. Recent episodes ?  precipitated by poorly functioning Cpap mask which has now been replaced.  Options including higher dose of Flecainide vs PRN "rescue" short acting BB  reviewed. She has elected to trial PRN BB. She will remain on  BID Metoprolol  Succinate as previous. If she has an episode of palps has been instructed to  take 25 mg of Metoprolol Tartrate PRN. To call if episode frequency continue  to increase as ablation may be a consideration as well. Ultimately I have  advised that she work on weight loss and continue with recent compliance with  Cpap.        **2\. Essential (primary) hypertension**    Clinical Notes: Well controlled on currant regimen. No issues with orthostasis  or hypotension.        **3\. Hypercholesterolemia**    Notes: Patient Educated with: [email or web address].pdf (AHANutritionSheet.pdf).    Clinical Notes: Benefits of weight loss and gradual graded exercise program  reviewed. Requesting recent lab work from her PCP.      Procedure Codes    ---      93000 IH    ---      Follow Up    ---    Dr. Audley Hose 2 mo    Electronically signed by Homero Fellers MD on 04/01/2016 at 07:31 PM EDT    Sign off status: Completed        * * *        [facility]    [address]    [place], [place] [address]    Tel: [phone]    Fax: [phone]              * * *          Patient: Emma [name], Emma [name] DOB: 20-Feb-1956 Progress Note: Danelle Earthly, ACNP  03/29/2016    ---    Note generated by eClinicalWorks EMR/PM Software ([email or web address])

## 2020-12-27 NOTE — Progress Notes (Signed)
* * *         [facility], [facility]**        ---    Lawernce Ion. Sindy Messing, MD [facility] Kieth Brightly. Donnetta Simpers, MD [facility] Barbera Setters Byrd Hesselbach, MD  [facility];    Darlyn Chamber, MD [facility] Roosvelt Maser, MD [facility] Shanon Brow, MD [facility]  Nile Dear. Audley Hose, MD [facility]    Kandis Mannan A. Karie Mainland, MD [facility] Johnell Comings. MacNaught, MD [facility] Mitchel Honour, MD Erma Pinto, MD [facility]    Colletta Maryland, MD Weston Anna, MD [facility] Kerby Moors, NP Maximino Greenland ,  NP        * * *     **Patient Name:** Emma [name]   **Date:** 06/10/2017    --- ---     **DOB:** 21-Sep-1956     **Referring Provider:** Mardene Sayer   **Appointment Provider:**  [name], MD        * * *    06/10/2017  Progress Notes: [name], MD    --- ---    ---        Current Medications    ---    Taking     * furosemide 40 mg tablet 1 tab(s) orally B.I.D.    ---    * Xarelto 20 mg tablet 1 tab(s) orally Q.D.    ---    * flecainide 50 mg tablet 1 tab(s) orally B.I.D.    ---    * Metoprolol Tartrate 25 mg tablet 1 tab(s) orally PRN for recurrent afib    ---    * loratadine 10 mg tablet 1 tab(s) orally once a day    ---    * Tylenol Extra Strength 500 mg tablet 2 tab(s) orally PRN    ---    * Metoprolol Succinate ER 25 mg tablet, extended release 1 tab(s) orally B.I.D.    ---    * Medication List reviewed and reconciled with the patient    ---      Past Medical History    ---      PAF - PVI procedure at [facility] 01/18/17 apparently unsuccessful, flecainide  doubled to 100 bid - postop complicated by pericarditis on colchicine    ---    HTN    ---    Obesity    ---    Osteoarthritis    ---    Spinal stenosis    ---    Fasle positive stress test with normal coronaries on cath 12/2016    ---      Surgical History    ---      C section    ---    rhinoplasty    ---    knee replacement    ---      Family History    ---      Father: deceased 39 yrs, CAD, MI, AAA    ---    Mother: deceased 55 yrs, Died of CA, initially lymphoma then sarcomas in leg  with  mets    ---    Siblings: sister- HTN, CAD    ---      Social History    ---    Tobacco Status: former smoker, How long has it been since you last smoked? >  10 years, Discontinued: 26s, Patient counseled on the dangers of tobacco  use: 03/29/2016.    Advised to lose weight: Yes Discussed elevated BMI with the patient and  importance of  losing weight for cardiovascular and general overall health,  Discussed elevated BMI with the patient and importance of losing weight for  cardiovascular and general overall health.    No Have you fallen in the past year?.    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    ---      Allergies    ---      ace inhibitors    ---      Review of Systems    ---     _._ :    no Change in Vision. no Major change in weight. no Nausea. no vomitting. no  Diarrhea. no hematemesis. no Blood in urine. no blood in stool. no black,  tarry stool. no focal weakness, numbness or tingling. no rashes. no  hallucinations. no fevers, chills. no decrease in appetite. no Intrascapular  pain. no Bruising. no abdominal pain.            Reason for Appointment    ---      1\. f/u PAF, HTN, followed by Dr. Ruffin Frederick    ---      History of Present Illness    ---     _History of Present Illness_ :    65 year old with a history of PAF, hypertension, and obesity, followed by Dr.  Ruffin Frederick, who presents for followup. She was last seen 4 months ago by NP  Rooks. Since her last visit, doing relatively well without issues. Has had  some irregular heart beats but not rapid and would disappear after a few  hours. Otherwise gained some weight. No chest pain, or SOB, nausea, vomiting,  diaphoreiss,PND, orthopnea, lightheadedness, edema or syncope. Seen by Dr.  Malcolm Metro and doing well.      Vital Signs    ---    HR 50, BP 144/86, Wt 333, Ht 65, BMI 55.41, Repeat BP 148/90, Med Assist: KV.      Examination    ---     _HS_ :    General: pleasant, obese, middle aged female, in no distress. ENMT EOMI,  anicteric,  MMM, OP clear. Neck: no JVD, bruits, or lymphadenopathy. Pulmonary  System clear to auscultation bilaterally, no wheezes or crackles. Cardiac: PMI  is nondisplaced, normal S1 S2, regular rate, no murmur, gallop or pericardial  rub. GI SYSTEM obese, soft, NT/ND, BS present, no guarding or rigidity.  Extremities no clubbing, no edema, + lymphedema. Peripheral pulses: normal  (2+) bilaterally. Skin: normal, no rash. Neurologic exam: grossly non-focal.  Pyschiatric appropriate.          Data    ---     _EKG (reviewed personally)_ :    06/10/17: sinus rhythm, 50, borderline IVCD, QTC .463.    _Lipids_ :    4/16: TC 214 TRI 103 HDL 60 LDL 133. 3/15: TC 214, TG 68, HDL 58, LDL 142.    _Lexiscan Myoview_ :    2012: normal.          Impression/Recommendations    ---       **1\. Paroxysmal atrial fibrillation**    Clinical Notes: Stable on flecainide and Xarelto. Did not entirely like the  way she felt on higher dose flecainide and will continue as prescribed.  Followed by Dr. Malcolm Metro.    ---        **2\. Hypercholesterolemia**    Notes: Patient Educated with: [email or web address].pdf (AHANutritionSheet.pdf).        **3\. Essential (primary) hypertension**    Start  Cozaar tablet, 25 mg, 1 tab(s), orally, once a day, 30 day(s), 30,  Refills 5    Clinical Notes: Previously on losartan, now off. Will resume and will have f/u  in 2 months with NP for following BPs.        Diagnostic Imaging    --- ---    Ardine Bjork: **EKG_    ---       Procedure Codes    ---      93000 IH    ---      Follow Up    ---    2 Months with NP, 6 months with me    Electronically signed by Homero Fellers MD on 06/10/2017 at 12:24 PM EDT    Sign off status: Completed        * * *        [facility]    [address]    [place], [place] [address]    Tel: [phone]    Fax: [phone]              * * *          Patient: Emma [name], Emma [name] DOB: 23-Aug-1956 Progress Note: [name], MD 06/10/2017    ---    Note generated by  eClinicalWorks EMR/PM Software ([email or web address])

## 2020-12-27 NOTE — Progress Notes (Signed)
* * *        **  Emma [name]    --- ---    63 Y old Female, DOB: [birth year]    [address], [name], [place], [place] [address]    Home: [phone]    Provider: [name], MD        * * *    Telephone Encounter    ---    Answered by   Carlynn Spry  Date: 08/28/2016         Time: 12:03 PM    Reason   CAll Dr. Malcolm Metro    --- ---            Message                      Pls call Dr. Malcolm Metro [phone]                Action Taken   [name], MD 08/29/2016 5:10:36 PM > will try weight  loss, open to AF ablation.                * * *                ---          * * *          Patient: Emma [name], Emma [name] DOB: [birth year] Provider: [name],  MD 08/28/2016    ---    Note generated by eClinicalWorks EMR/PM Software ([email or web address])

## 2020-12-27 NOTE — Progress Notes (Signed)
* * *        **  Emma [name]    --- ---    51 Y old Female, DOB: 05-09-1956    [address], [name], [place], [place] [address]    Home: [phone]    Provider: [name], MD        * * *    Telephone Encounter    ---    Answered by   Rolena Infante  Date: 09/14/2016         Time: 02:32 PM    Reason   chg time to 3pm    --- ---            Message                      LMOM to change time to arrive at [facility] as there was already [name] patient scheduled to come at 1:15...  tried to call cell phone but wron number is entered                    * * *                ---          * * *          Patient: Emma [name], Emma [name] DOB: Apr 02, 1956 Provider: [name],  MD 09/14/2016    ---    Note generated by eClinicalWorks EMR/PM Software ([email or web address])

## 2020-12-27 NOTE — Progress Notes (Signed)
* * *         [facility], [facility]**        ---    Lawernce Ion. Sindy Messing, MD [facility] Kieth Brightly. Donnetta Simpers, MD [facility] Barbera Setters Byrd Hesselbach, MD  [facility];    Darlyn Chamber, MD [facility] Roosvelt Maser, MD [facility] Shanon Brow, MD [facility]  Nile Dear. Audley Hose, MD [facility]    Kandis Mannan A. Karie Mainland, MD [facility] Johnell Comings. MacNaught, MD [facility] Mitchel Honour, MD Erma Pinto, MD [facility]    Colletta Maryland, MD Weston Anna, MD [facility] Kerby Moors, NP Maximino Greenland ,  NP        * * *     **Patient Name:** Emma [name]   **Date:** 01/28/2017    --- ---     **DOB:** 1956-04-07     **Referring Provider:** Mardene Sayer   **Appointment Provider:** Shaylah Mcghie, NP        * * *    01/28/2017  Progress Notes: Lajada Janes, NP    --- ---    ---        Current Medications    ---    Taking     * furosemide 40 mg tablet 1 tab(s) orally B.I.D.    ---    * Xarelto 20 mg tablet 1 tab(s) orally Q.D.    ---    * flecainide 100 mg tablet 1 tab(s) orally B.I.D.    ---    * Metoprolol Tartrate 25 mg tablet 1 tab(s) orally PRN for recurrent afib    ---    * loratadine 10 mg tablet 1 tab(s) orally once a day    ---    * Tylenol Extra Strength 500 mg tablet 2 tab(s) orally PRN    ---    * colchicine 0.6 mg tablet 1 tab(s) orally once a day    ---    * Metoprolol Succinate ER 25 mg tablet, extended release 1 tab(s) orally B.I.D.    ---    * Medication List reviewed and reconciled with the patient    ---      Past Medical History    ---      PAF - PVI procedure at [facility] 01/18/17 apparently unsuccessful, flecainide  doubled to 100 bid - postop complicated by pericarditis on colchicine    ---    HTN    ---    Obesity    ---    Osteoarthritis    ---    Spinal stenosis    ---    Fasle positive stress test with normal coronaries on cath 12/2016    ---      Surgical History    ---      C section    ---    rhinoplasty    ---    knee replacement    ---    pulmonary vein isolation - Dr Malcolm [facility] 12/2016    ---      Family History    ---      Father: deceased 63 yrs,  CAD, MI, AAA    ---    Mother: deceased 89 yrs, Died of CA, initially lymphoma then sarcomas in leg  with mets    ---    Siblings: sister- HTN, CAD    ---      Social History    ---    no Tobacco Discontinued: 1980s, Status: former smoker, How long has it been  since you last  smoked? > 10 years, Patient counseled on the dangers of tobacco  use: 03/29/2016.    Advised to lose weight: Yes Discussed elevated BMI with the patient and  importance of losing weight for cardiovascular and general overall health,  Discussed elevated BMI with the patient and importance of losing weight for  cardiovascular and general overall health.    No Have you fallen in the past year?.    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    ---      Allergies    ---      ace inhibitors    ---      Review of Systems    ---    See HPI. Otherwise denies change in vision, headaches, major change in weight.  No nausea, vomiting, diarrhea, abdominal pain. Denies abnormal bleeding, skin  rashes/sores. No new focal weakness, numbness or tingling. Denies polyuria,  polydispia. No fevers, chills, malaise, productive cough, hemoptysis or major  change in appetite.        Reason for Appointment    ---      1\. 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    01/08/17 catheterization performed by Dr. Ronnette Hila showed normal coronary  arteries, diastolic heart failure with elevated left heart filling pressures  and normal LV systolic function. She was started on lasix 40 mg BID and HCTZ  was stopped. She has tolerated this well and f/u labs show normal kidneys and  potassium level.    Since the last visit she was admitted 4/20 to [facility] for an  elective PVI ablation with Dr. Malcolm Metro. There are no  records available to me  today but per her report the procedure failed and they were also unable to  cardiovert her. Her rate control was adjusted during the admission due to  rapid A. fib and she subsequently converted on her own and has been in normal  sinus rhythm symptomatically since. Both flecainide and metoprolol doses were  doubled. Her postop course was apparently complicated by pericarditis and she  has been on colchicine once daily since then for a total of 2 weeks with no  recurrent chest pressure. She did have an echocardiogram before discharge and  does not believe she had any significant pericardial effusions. She goes for  follow up on 5/7. She hopes she doesn't need a 2nd ablation procedure and  doesn't think she would do it anyway after what she went through during this  recent admission.    In terms of her A. fib she remains on flecainide which was doubled to 100 mg  twice a day and metoprolol was also doubled to 50 mg twice a day during [facility]  admission. Since discharge she noted a heart rate around 40 and a funny  feeling in her chest and not feeling right. She reduced metoprolol back to 25  mg twice a day which was her previous dose and reportedly feels better with  HRs in the 50s.    She has not had any recurrent palpitations to signify recurrent A. fib. Prior  to all of this her last episode of A. fib was in  January and prior to that  April 2017. She feels that it has been fairly well controlled on flecainide  and metoprolol combination and wishes she never went ahead with the ablation.  Also doesn't want to consider a different antiarrhythmic at this time. She  hopes to lose a significant amount of weight and get rid of her sleep apnea  and is avoiding all triggers like ETOH, caffeine, etc.    No new complaints such as orthopnea, PND, lower extremity edema. Weight has  been stable. No lightheadedness or syncope.      Vital Signs    ---    HR 52, BP 122/82, Wt 326, Ht 65, BMI 54.24, Med Assist:  dd.      Examination    ---     _HS_ :    General: pleasant, obese, middle aged female, in no distress. ENMT EOMI,  anicteric, MMM. Neck: no JVD, bruits. Pulmonary System clear to auscultation  bilaterally, no wheezes or crackles. Cardiac: PMI is nondisplaced, mild  bradycardia, mid 50s, normal S1 S2, regular rate, no murmur, gallop or  pericardial rub. GI SYSTEM obese, soft. Extremities no clubbing, no edema.  large ecchymotic area fading left wrist. Peripheral pulses: normal (2+)  bilaterally. Skin: normal, no rash. Neurologic exam: grossly non-focal.  Pyschiatric appropriate.          Data    ---     _EKG (reviewed personally)_ :    Piers Baade 01/28/2017 1:56:28 PM > sinus bradycardia, 52 BPM, PRWP, nonspecific  ST changes. 09/12/16: sinus rhythm, 55 .    _Echo_ :    10/2016: Borderline asymmetric LV hypertrophy and normal LV systolic function  of 65%. Moderate to severe LAE. Mild pulmonary hypertension with PA pressure  of 43 mmHg and elevated estimated RAP. Fat pad anteriorly. Borderline  ascending aorta dilatation of 3.9 cm.    _Lipids_ :    4/16: TC 214 TRI 103 HDL 60 LDL 133. 3/15: TC 214, TG 68, HDL 58, LDL 142.    _Lexiscan Myoview_ :    12/2016: Negative ETT for chest pain or diagnostic EKG changes. Mild to  moderate LAD ischemia on imaging with mildly elevated TID of 1.18. Breast  attenuation which may reduce specificity of this finding. Normal LV size and  wall motion.Emogene Morgan catheterization_ :    01/08/17: Normal coronary arteries with diastolic heart failure and elevated  left heart filling pressures. Normal LV systolic function.          Impression/Recommendations    ---       **1\. Abnormal stress test with nuclear imaging**    Clinical Notes: False positive. Normal coronaries on cardiac cath.    ---        **2\. Chronic diastolic (congestive) heart failure**    Clinical Notes: Diastolic dysfunction and elevated left heart filling  pressures on cath.    Doing well with stable kidney functioning  s/p diuretic change from HCTZ to  lasix 40 BID.    Continue the same along with NAS diet, weight monitoring.        **3\. Paroxysmal atrial fibrillation**    _IMAGING: **EKG_    Clinical Notes: s/p PVI 4/20. Apparently unsuccessful and unable to  cardiovert; records requested as history taken by patient recall today. Lately  spontaneously converted w/ increased flecainide to 100 bid and metoprolol to  50 bid. Metoprolol reduced on own to 25 bid due to slow HR/symptoms and  remains in SR today.  Has f/u with Dr. Malcolm Metro next week. Doesn't want any further procedures and  says she wished she never went for ablation. Her afib has actually been quite  infrequent based on her report and for now would like to remain on flecainide  although discussed it's limitations and potential use of other antiarrhythmics  such as sotalol in the future. Will come back in a few months to revisit. For  now she is very motivated to lose weight and focusing on trigger reduction.  Continues on Xarelto.        **4\. Essential (primary) hypertension**    Notes: Patient Educated with: AHANutritionSheet.pdf (AHANutritionSheet.pdf).    Clinical Notes: Normotensive and stable on current regimen.        **5\. Hypercholesterolemia**    Notes: Patient Educated with: AHANutritionSheet.pdf (AHANutritionSheet.pdf)  Patient Educated with: AHANutritionSheet.pdf (AHANutritionSheet.pdf).    Clinical Notes:    Normotensive. Continue the same.        **6\. Pericarditis**    Clinical Notes: Apparently w/ pericarditis post-PVI.    No recurrent symptoms and EKG today benign.    On colchicine for 1 more week.    Will obtain records including echo done during [facility] admit to determine whether  f/u echo is needed. She doesn't believe she had any significant effusion.      Preventive Medicine    ---      Counseling: Lifestyle therapeutic issues . Diet . Exercise .    ---      Procedure Codes    ---      93000 IH    ---      Follow Up    ---    3-4 months MD     Electronically signed by Homero Fellers MD on 02/13/2017 at 06:44 PM EDT    Sign off status: Completed        * * *        [facility]    [address]    [place], [place] [address]    Tel: [phone]    Fax: [phone]              * * *          Patient: Emma [name], Emma [name] DOB: Mar 28, 1956 Progress Note: Lalanya Rufener, NP  01/28/2017    ---    Note generated by eClinicalWorks EMR/PM Software ([email or web address])

## 2020-12-27 NOTE — Progress Notes (Signed)
* * *        **  Emma [name]    --- ---    47 Y old Female, DOB: 02/08/1956    [name] [name], [place], [place] [address]    Home: [phone]    Provider: [name], MD        * * *    Web Encounter    ---    Answered by   Elicia Lamp  Date: 01/24/2017         Time: 10:26 AM    Caller   Pt    --- ---            Reason   Labs needed after recent cath?            Message                      Pt had [name] cath done with you on 01/08/17. Pt says she supposed to have labs done before f/u with [facility] on 04/30, but she lost the lab slip. Can you recall what you wanted her to get done? she thinks it was [name] BMP.       pt [phone]                Action Taken   Mariel Aloe, MD 01/24/2017 5:22:56 PM > it was [name] BMP.  Thanks. KJM Sneed,Felicia 01/25/2017 10:45:31 AM > Order faxed to pt service  center in [place] per pt, [phone].                * * *              * * *        ---        Reason for Appointment    ---      1\. Labs needed after recent cath?    ---      Current Medications    ---      None    ---      Past Medical History    ---      PAF    ---    HTN    ---    Obesity    ---    Osteoarthritis    ---    Spinal stenosis    ---      Allergies    ---      ace inhibitors    ---      Assessments    ---    1\. Essential (primary) hypertension - I10 (Primary)    ---      Impression/Recommendations    ---       **1\. Essential (primary) hypertension**    _LAB: BMP (Ordered for 01/25/2017)_    ---          * * *           Patient: Emma [name], Emma [name] DOB: 12/26/1955 Provider: [name],  MD 01/24/2017    ---    Note generated by eClinicalWorks EMR/PM Software ([email or web address])

## 2020-12-27 NOTE — Progress Notes (Signed)
* * *        **  Emma [name]    --- ---    6 Y old Female, DOB: 1956/01/29    [address], [name], [place], [place] [address]    Home: [phone]    Provider: [name], MD        * * *    Telephone Encounter    ---    Answered by   Rolena Infante  Date: 09/19/2016         Time: 01:14 PM    Reason    HOLD METOPROLOL??    --- ---            Message                      You have ordered [name] 2 day ett-myoview on this patient with PAF.. she is on metoprolol, should she hold or take med for test and if on med and does not reach HR, change to lexi?  Please advise.Bartholomew Boards, ML                Action Taken   [name], MD 09/19/2016 5:06:19 PM > yes hold  Rolena Infante 09/20/2016 6:59:53 AM > ty                * * *                ---          * * *          Patient: Emma [name], Emma [name] DOB: 10/22/1955 Provider: [name],  MD 09/19/2016    ---    Note generated by eClinicalWorks EMR/PM Software ([email or web address])

## 2020-12-27 NOTE — Progress Notes (Signed)
* * *        **  [name]    --- ---    28 Y old Female, DOB: Jul 23, 1956    [address], [address], [place], [place] [address]    Home: [phone]    Provider: Danelle Earthly        * * *    Telephone Encounter    ---    Answered by   Danelle Earthly  Date: 03/29/2016         Time: 02:18 PM    Reason   recent labs    --- ---            Message                      Had labs at [facility] in April 2017.  PCP has a copy.  Can we please get copy from her PCP                Action Taken   Neill Jurewicz 03/29/2016 2:19:06 PM > . Dominique,Denise 04/04/2016  10:41:09 AM > called pcp..they have no idea where the labs were  put...suggested I get them from [facility] general..Marland KitchenI faxed for copy of labs  Dominique,Denise 04/20/2016 12:47:44 PM > labs are in documents Janeane Cozart  04/23/2016 4:02:18 PM > Reviewed and WNL                * * *                ---          * * *          Patient: Emma [name] A DOB: 07-15-1956 Provider: Danelle Earthly  03/29/2016    ---    Note generated by eClinicalWorks EMR/PM Software ([email or web address])

## 2020-12-27 NOTE — Progress Notes (Signed)
* * *        **  [name]    --- ---    21 Y old Female, DOB: [birth year]    [address], [place], [place] [address]    Home: [phone]    Provider: [name], MD        * * *    Web Encounter    ---    Answered by   Elicia Lamp  Date: 11/12/2016         Time: 02:02 PM    Caller   Pt    --- ---            Reason   2 day myoview            Message                      Pt had to cx Feb 13,14 2 day nuc appt. Can Tam help pt to rebook to make sure it's booked correctly? Thank you.       Pt [phone]                Action Taken   Dinh,Tam 11/13/2016 5:01:16 PM > S/W PT, WILL CALL BACK WITH  MARCH WORK SCHED, PT COULD NOT COORDINATE APPT WITH FEB SCHED. Dinh,Tam  11/27/2016 11:40:50 AM > pt booked                * * *                ---          * * *          Patient: [name], [name] DOB: June 01, 1956 Provider: [name],  MD 11/12/2016    ---    Note generated by eClinicalWorks EMR/PM Software ([email or web address])

## 2020-12-27 NOTE — Progress Notes (Signed)
* * *        **  Emma [name]    --- ---    63 Y old Female, DOB: 09/12/1956    [address] [place], [place] [address]    Home: [phone]    Provider: [name], MD        * * *    Telephone Encounter    ---    Answered by   Deno Lunger  Date: 08/12/2018         Time: 09:06 AM    Reason   no show    --- ---            Message                      09/30                 Action Taken                      [name],[name]  08/14/2018 11:28:18 AM > error                     * * *                ---          * * *          Patient: [name] DOB: 1956-03-22 Provider: [name],  MD 08/12/2018    ---    Note generated by eClinicalWorks EMR/PM Software ([email or web address])

## 2020-12-27 NOTE — Progress Notes (Signed)
* * *         [facility], [facility]**        ---    Lawernce Ion. Sindy Messing, MD [facility] Kieth Brightly. Donnetta Simpers, MD [facility] Barbera Setters Byrd Hesselbach, MD  [facility];    Darlyn Chamber, MD [facility] Roosvelt Maser, MD [facility] Shanon Brow, MD [facility]  Nile Dear. Audley Hose, MD [facility]    Kandis Mannan A. Karie Mainland, MD [facility] Johnell Comings. MacNaught, MD [facility] Mitchel Honour, MD Erma Pinto, MD [facility]    Colletta Maryland, MD Weston Anna, MD [facility] Kerby Moors, NP Maximino Greenland ,  NP        * * *     **Patient Name:** Emma [name]   **Date:** 06/21/2016    --- ---     **DOB:** 02-27-1956     **Referring Provider:** Mardene Sayer   **Appointment Provider:**  [name], MD        * * *    06/21/2016  Progress Notes: [name], MD    --- ---    ---        Current Medications    ---    Taking     * Metoprolol Tartrate 25 mg tablet 1 tab(s) orally orally once daily as needed for episodes of atrial fibrillation    ---    * loratadine 10 mg tablet 1 tab(s) orally once a day    ---    * losartan 50 mg tablet 1 tab(s) orally BID    ---    * Xarelto 20 mg tablet TAKE ONE TABLET BY MOUTH IN THE EVENING orally Q.D.    ---    * Tylenol Extra Strength 500 mg tablet 2 tab(s) orally prn    ---    * Metoprolol Succinate ER 25 mg tablet, extended release 1 tab(s) orally bid    ---    * flecainide 50 mg tablet 1 tab(s) orally bid    ---    * hydrochlorothiazide 25 mg tablet 1 tab(s) orally once a day    ---    * Medication List reviewed and reconciled with the patient    ---      Past Medical History    ---      PAF    ---    HTN    ---    Obesity    ---    Osteoarthritis    ---    Spinal stenosis    ---      Surgical History    ---      C section    ---    rhinoplasty    ---    knee replacement    ---      Family History    ---      Father: deceased 38 yrs, CAD, MI, AAA    ---    Mother: deceased 63 yrs, Died of CA, initially lymphoma then sarcomas in leg  with mets    ---    Siblings: sister- HTN, CAD    ---      Social History    ---     Smoking Discontinued: 1980s, Status: former smoker, How long has it been since  you last smoked? > 10 years, Patient counseled on the dangers of tobacco use:  03/29/2016.    Advised to lose weight: Yes Discussed elevated BMI with the patient and  importance of losing weight for cardiovascular and general overall health,  Discussed elevated BMI with the patient and importance of losing weight for  cardiovascular and general overall health.    No Presence of Falls .    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    ---      Allergies    ---      ace inhibitors    ---      Review of Systems    ---     _._ :    no Change in Vision. no Major change in weight. no Nausea. no vomitting. no  Diarrhea. no hematemesis. no Blood in urine. no blood in stool. no black,  tarry stool. no focal weakness, numbness or tingling. no rashes. no  hallucinations. no fevers, chills. no decrease in appetite. no Intrascapular  pain. no Bruising. no abdominal pain.            Reason for Appointment    ---      1\. 2 mos per Nancy;called pt mailed appt. card    ---      History of Present Illness    ---     _HPI_ :    65 year old with a history of PAF, hypertension, and obesity, followed by Dr.  Ruffin Frederick, who presents for followup. She was last seen 3 months ago by NP Jacqulynn Cadet  due to recurrent atrial fibrillation.. Since her last visit no recurrence of  palpitations or atrial fibrillation. Has not needed as needed metoprolol.  Taking medications fairly consistently with only an occasional missed dose.  Otherwise denies chest pain or dyspnea nausea vomiting diaphoresis PND  orthopnea lightheadedness palpitations or syncope.      Vital Signs    ---    HR 52, BP 120/82, Wt 328, Ht 65, BMI 54.58.      Examination    ---     _HS_ :    General: pleasant, obese, middle aged female, in no distress. ENMT EOMI,  anicteric, MMM, OP clear. Neck: no JVD, bruits, or lymphadenopathy. Pulmonary  System clear to auscultation bilaterally, no  wheezes or crackles. Cardiac: PMI  is nondisplaced, normal S1 S2, regular rate, no murmur, gallop or pericardial  rub. GI SYSTEM obese, soft, NT/ND, BS present, no guarding or rigidity.  Extremities no clubbing, no edema, + lymphedema. Peripheral pulses: normal  (2+) bilaterally. Skin: normal, no rash. Neurologic exam: grossly non-focal.  Pyschiatric appropriate.          Data    ---     _EKG (reviewed personally)_ :    06/21/16: Sinus rhythm, 52, poor R wave progression.    _Lipids_ :    4/16: TC 214 TRI 103 HDL 60 LDL 133. 3/15: TC 214, TG 68, HDL 58, LDL 142.    _Lexiscan Myoview_ :    2012: normal.          Impression/Recommendations    ---       **1\. Paroxysmal atrial fibrillation**    Continue flecainide tablet, 50 mg, 1 tab(s), orally, bid, 90, 180, Refills 3    Continue hydrochlorothiazide tablet, 25 mg, 1 tab(s), orally, once a day, 90,  90, Refills 3    _IMAGING: **EKG_    Clinical Notes: Paroxysmal AF, reasonably controlled with flecainide. Using  metoprolol as needed and doing relatively well. No changes made  today.utilizing CPAP mask consistently.    ---        **2\. Hypercholesterolemia**    Notes: Patient Educated with: AHANutritionSheet.pdf (AHANutritionSheet.pdf).  Clinical Notes: Strongly encouraged for weight loss.        **3\. Essential (primary) hypertension**    Clinical Notes: Normotensive and stable current regimen.      Procedure Codes    ---      93000 IH    ---      Follow Up    ---    6 Months    Electronically signed by Homero Fellers MD on 06/21/2016 at 02:36 PM EDT    Sign off status: Completed        * * *        [facility]    [address]    [place], [place] [address]    Tel: [phone]    Fax: [phone]              * * *          Patient: Emma [name], Emma [name] DOB: 1956/06/01 Progress Note: [name], MD 06/21/2016    ---    Note generated by eClinicalWorks EMR/PM Software ([email or web address])

## 2020-12-27 NOTE — Progress Notes (Signed)
* * *        **  [name]    --- ---    66 Y old Female, DOB: Nov 25, 1955    [name] [name], [place], [place] [address]    Home: [phone]    Provider: [name], MD        * * *    Telephone Encounter    ---    Answered by   Maximino Greenland  Date: 01/28/2017         Time: 01:42 PM    Reason   [facility] admisssion    --- ---            Message                      she was admitted to [facility] 4/20 for afib ablation, pericarditis, can we obtain her records inc echo, procedure note, discharge summary? thx                Action Taken   Dominique,Denise 01/30/2017 12:36:22 PM > faxed                * * *                ---          * * *          Patient: Emma [name], Emma [name] DOB: November 10, 1955 Provider: [name],  MD 01/28/2017    ---    Note generated by eClinicalWorks EMR/PM Software ([email or web address])

## 2020-12-27 NOTE — Progress Notes (Signed)
* * *        **  Emma [name]    --- ---    81 Y old Female, DOB: [birth year]    [address], [place], [place] [address]    Home: [phone]    Provider: [name], MD        * * *    Telephone Encounter    ---    Answered by   Homero Fellers D  Date: 01/02/2017         Time: 06:48 PM    Message                      call re: stress test        --- ---            Action Taken   [name], MD 01/03/2017 5:17:28 PM > please schedule  catheterization with Dr Karie Mainland re: abnormal ETT [name],[name] 01/04/2017 10:30:44 AM  > made procedure appt called pt LMOM                * * *              * * *        ---        Current Medications    ---      None    ---      Past Medical History    ---      PAF    ---    HTN    ---    Obesity    ---    Osteoarthritis    ---    Spinal stenosis    ---      Allergies    ---      ace inhibitors    ---      Assessments    ---    1\. Abnormal stress test with nuclear imaging - R94.30 (Primary)    ---      Impression/Recommendations    ---       **1\. Abnormal stress test with nuclear imaging**    _LAB: Cath [facility] Orders: CBC/PT/PTT/BMP/EKG (Ordered for 01/04/2017)_    *Cardiac Catheterization: Left heart (Ordered for 01/03/2017)[id number]     ---          * * *          Patient: Emma [name], Emma [name] DOB: 10-Jan-1956 Provider: [name],  MD 01/02/2017    ---    Note generated by eClinicalWorks EMR/PM Software ([email or web address])

## 2020-12-27 NOTE — Progress Notes (Signed)
* * *         [facility]**        ---    Lawernce Ion. Sindy Messing, MD [facility] Kieth Brightly. Donnetta Simpers, MD [facility] Barbera Setters Byrd Hesselbach, MD  [facility];    Darlyn Chamber, MD [facility] Roosvelt Maser, MD [facility] Shanon Brow, MD [facility]  Nile Dear. Audley Hose, MD [facility]    Kandis Mannan A. Karie Mainland, MD [facility] Johnell Comings. MacNaught, MD [facility] Mitchel Honour, MD Erma Pinto, MD [facility]    Colletta [place], MD Weston Anna, MD [facility] Kerby Moors, NP Maximino Greenland ,  NP        * * *     **Patient Name:** Emma [name]   **Date:** 09/12/2016    --- ---     **DOB:** [birth year]     **Referring Provider:** Mardene Sayer   **Appointment Provider:**  [name], MD        * * *    09/12/2016  Progress Notes: [name], MD    --- ---    ---        Current Medications    ---    Taking     * Xarelto 20 mg tablet 1 tab(s) orally Q.D.    ---    * flecainide 50 mg tablet 1 tab(s) orally B.I.D.    ---    * hydrochlorothiazide 25 mg tablet 1 tab(s) orally once a day    ---    * Metoprolol Tartrate 25 mg tablet 1 tab(s) orally orally once daily as needed for episodes of atrial fibrillation    ---    * loratadine 10 mg tablet 1 tab(s) orally once a day    ---    * losartan 50 mg tablet 1 tab(s) orally BID    ---    * Metoprolol Succinate ER 25 mg tablet, extended release 1 tab(s) orally BID    ---    * Tylenol Extra Strength 500 mg tablet 2 tab(s) orally PRN    ---    * Medication List reviewed and reconciled with the patient    ---      Past Medical History    ---      PAF    ---    HTN    ---    Obesity    ---    Osteoarthritis    ---    Spinal stenosis    ---      Surgical History    ---      C section    ---    rhinoplasty    ---    knee replacement    ---      Family History    ---      Father: deceased 70 yrs, CAD, MI, AAA    ---    Mother: deceased 61 yrs, Died of CA, initially lymphoma then sarcomas in leg  with mets    ---    Siblings: sister- HTN, CAD    ---      Social History    ---    Tobacco Discontinued: 1980s,  Status: former smoker, How long has it been since  you last smoked? > 10 years, Patient counseled on the dangers of tobacco use:  03/29/2016.    Advised to lose weight: Yes Discussed elevated BMI with the patient and  importance of losing weight for cardiovascular and general overall health,  Discussed elevated BMI with the  patient and importance of losing weight for  cardiovascular and general overall health.    No Presence of Falls .    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    ---      Allergies    ---      ace inhibitors    ---      Review of Systems    ---     _._ :    no Change in Vision. no Major change in weight. no Nausea. no vomitting. no  Diarrhea. no hematemesis. no Blood in urine. no blood in stool. no black,  tarry stool. no focal weakness, numbness or tingling. no rashes. no  hallucinations. no fevers, chills. no decrease in appetite. no Intrascapular  pain. no Bruising. no abdominal pain.            Reason for Appointment    ---      1\. HFU AF    ---      History of Present Illness    ---     _MSSP_ :    65 year old with a history of PAF, hypertension, and obesity, followed by Dr.  Ruffin Frederick, who presents for followup. She was last seen 3 months ago. Since her  last visit, hospitalized last month with atrial fibrillation. Attempted  chemical cardioversion with flecainide leading to junctional bradycardia and  requiring transient dopamine. Spontaneously converted and sent home. Had  follow-up with Dr. Malcolm Metro at [facility] and pulmonary vein isolation is tentatively  scheduled for March. She was instructed to pursue significant weight loss.  Otherwise feeling well and reports no significant issues at this time and  denies chest pain or dyspnea nausea vomiting diaphoresis PND orthopnea  lightheadedness palpitations or syncope.      Vital Signs    ---    HR 55, BP 128/72, Wt 333, Ht 65, BMI 55.41, Med Assist: sm.      Examination    ---     _HS_ :    General: pleasant, obese, middle  aged female, in no distress. ENMT EOMI,  anicteric, MMM, OP clear. Neck: no JVD, bruits, or lymphadenopathy. Pulmonary  System clear to auscultation bilaterally, no wheezes or crackles. Cardiac: PMI  is nondisplaced, normal S1 S2, regular rate, no murmur, gallop or pericardial  rub. GI SYSTEM obese, soft, NT/ND, BS present, no guarding or rigidity.  Extremities no clubbing, no edema, + lymphedema. Peripheral pulses: normal  (2+) bilaterally. Skin: normal, no rash. Neurologic exam: grossly non-focal.  Pyschiatric appropriate.          Data    ---     _EKG (reviewed personally)_ :    09/12/16: sinus rhythm, 55.    _Lipids_ :    4/16: TC 214 TRI 103 HDL 60 LDL 133. 3/15: TC 214, TG 68, HDL 58, LDL 142.    _Lexiscan Myoview_ :    2012: normal.          Impression/Recommendations    ---       **1\. Paroxysmal atrial fibrillation**    _IMAGING: **Echocardiogram (Ordered for 09/12/2016)_    _IMAGING: **ETT-Myoview 2 day protocol (Ordered for 09/12/2016)_    Clinical Notes: Remains on flecainide therapy for now. We discussed the  possibility of using an alternative agent such as sotalol but she still wants  to consider PVI for now. We'll have her obtain stress andechocardiogram for  further evaluation.    ---        **  2\. Essential (primary) hypertension**    Notes: Patient Educated with: [email or web address].pdf ([email or web address].pdf).    Clinical Notes: Normotensive and stable current regimen.        **3\. Hypercholesterolemia**    Clinical Notes: Strongly encouraged for weight loss.        Diagnostic Imaging    --- ---    Ardine Bjork: **EKG_    ---       Procedure Codes    ---      93000 IH    ---      Follow Up    ---    end of february    Electronically signed by Homero Fellers MD on 09/12/2016 at 04:00 PM EST    Sign off status: Completed        * * [facility]    [address]    [place], [place] [address]    Tel: [phone]    Fax: [phone]              * * *          Patient:  Emma [name], Emma [name] DOB: 02-07-1956 Progress Note: [name], MD 09/12/2016    ---    Note generated by eClinicalWorks EMR/PM Software ([email or web address])

## 2020-12-27 NOTE — Progress Notes (Signed)
* * *        **  Emma [name]    --- ---    69 Y old Female, DOB: 1956-03-06    [address] [name], [place], [place] [address]    Home: [phone]    Provider: [name], MD        * * *    Telephone Encounter    ---    Answered by   Cletus Gash  Date: 07/17/2018         Time: 12:11 PM    Caller   Pt    --- ---            Reason   medical records            Message                      Pt is seeing [name] new cardiologist in [facility] & needs her records faxed to him--Dr Nanetta Batty--      [facility]--(F) [phone]  (pt had appt next Tuesday)--so they need to be faxed ASAP Please!  Thanks!                Action Taken                      [name],[name]  07/17/2018 12:25:08 PM > Faxed already this AM see FAX Log                     * * *                ---          * * *          Patient: Emma [name], Emma [name] DOB: [birth year] Provider: [name],  MD 07/17/2018    ---    Note generated by eClinicalWorks EMR/PM Software ([email or web address])

## 2020-12-27 NOTE — Progress Notes (Signed)
* * *        **Emma [name]**    --- ---    5 Y old Female, DOB: 05-Apr-1956, External MRN: [id number]    Account Number: [id number]    [address], [address], [place], [id number]    Home: [phone]    Insurance: HMO BLUE OUT IPA Payer ID: PAPER    PCP: Emma Sayer, MD Referring: Emma [name] External Visit ID:  [id number]    Appointment Facility: [name]        * * *    02/04/2017  Progress Notes: Emma Minors, MD **CHN#:** [phone]    --- ---    ---        Current Medications    ---    Taking     * Colchicine 0.4 mg Once a day    ---    * Flecainide Acetate 100MG  Tablet Orally twice daily    ---    * Furosemide 40 MG Once a day    ---    * Loratadine 10 MG Tablet 1 tablet Orally Once a day    ---    * Metoprolol Succinate ER 25 mg Tablet Extended Release 24 Hour 1 tablet Orally bid    ---    * Metoprolol Tartrate 25 mg Tablet 1 tablet with food Orally prn    ---    * Prevagen Extra Strength 20 MG Capsule Orally     ---    * Xarelto 20 MG Tablet 1 tablet with food Orally Once a day    ---    * Medication List reviewed and reconciled with the patient    ---      Past Medical History    ---       Paroxysmal Atrial fibrillation.        ---    Hypertension.        ---    Osteoarthritis.        ---    Obesity.        ---    Spinal Stenosis.        ---    OSA - on CPAP which she uses consistently.        ---      Family History    ---      Father Deceased 64 years: CAD, MI, AAA    Mother: Deceased 7 years, died of CA: lymphoma, sarcoma with METS    Sister: CAD, HTN.    ---      Social History    ---      Quit Smoking in 1980s    No Illicit drugs.    ---      Allergies    ---      ace inhibitors    ---    [Allergies Verified]      Review of Systems    ---     _Cardiology - [facility]_ :    Constitutional \+ weight gain or loss in the past year, no night sweats or  fevers, no loss of appetite, no feelings of fatigue. Cardiovascular no chest  discomfort or pain, \+ edema of the feet or  ankles, \+ shortness of breath, no  palpitations, no syncope, no presyncope. Ear/Nose/Mouth/Throat no hoarseness  or change in voice, no hearing difficulty, + ringing in ears, no nasal  congestion, no post-nasal drip, no nose bleeds, no loss of smell. Endocrine \+  awakening at night to urinate, does  not drink lots of water, no feeling of  cold or hot intolerance. Neurology/Psychiatry no dizziness or lightheadedness,  no unsteady gait, no numbness or tingling sensation, no headaches, no  forgetfulness, no anxious or nervous feelings, no insomnia, no feeling of  being too sleepy all the time. Respiratory no cough, no sputum or phlegm  production, no blood in sputum, no difficulty breathing with activity or rest,  no wheezing, no loud snoring.            Reason for Appointment    ---      1\. Atrial fibrillation    ---      History of Present Illness    ---     _Cardiology_ :    I had the pleasure of seeing [name] on follow up at [facility] on Monday Feb 04, 2017 2 weeks after her AF  ablation in the company of her husband.    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 After failing flecainide, she was referred for ablation of her  paroxysmal atrial fibrillation. The risk factors she has are obesity, sleep  apnea, and hypertension. On 01/18/2017, she underwent pulmonary vein isolation  using cryoablation. There was some difficulty in isolating the left superior  pulmonary vein. Even after it was isolated pacing or mechanical trauma to the  left atrium led to the precipitation of atrial fibrillation. In light of this,  she was advised to resume the flecainide that had been held prior to the  ablation, albeit at higher dose. She had been on 50 mg twice a day, she was  hospitalized for 2 days during which her dose of flecainide was raised to 100  mg twice a day.  She tolerated this well and in fact converted spontaneously  back into sinus rhythm after organizing into an atrial tachycardia.    [name] has returned to work and has not had any recurrence of her PAF.  She has OSA and is compliant with CPAP. She notes that over the past 3 years  since starting flecainide she has gained about 50 lbs. She has ben strongly  urged and encouraged to work on exercising and losing weight as part of a  comprehensive approac in the management of her AF.     _Associated Providers_ :    Primary Care Provider Dr. Mardene [name]. Referring Providers Dr. Homero [name].      Vital Signs    ---    Pain scale 0, Ht-in 65, Wt-lbs 330, BMI **54.909** , BP 118/60, HR 50, SaO2 98  %.      Examination    ---     _Cardiac Testing_ :    EKG: ( 02/04/2017 ) , Sinus bradycardia 50 bpm.    Echo: ****    **01/21/2017    Left Ventricle **The left ventricle is normal in size. Mildly increased left  ventricular wall thickness. Left ventricular systolic function is normal.  Ejection Fraction = 55%. No regional wall motion abnormalities noted.    **Right Ventricle** The right ventricle is mildly dilated. The right  ventricular systolic function is low normal.    **Atria** The left atrium is moderately dilated. The right atrium is mild to  moderately dilated. The inferior vena cava is normal size with > 50%  respirophasic variation, consistent with normal right atrial pressure.    **  Mitral Valve** The mitral valve is normal. There is mild mitral annular  calcification. There is trace mitral regurgitation.    **Tricuspid Valve** The tricuspid valve is not well visualized, but is grossly  normal. There is trace tricuspid regurgitation.    **Aortic Valve** The aortic valve is mildly thickened. The aortic valve is  trileaflet. The aortic valve opens well. Trace aortic regurgitation.    **Pulmonic Valve** The pulmonic valve is not well seen, but is grossly normal.  Trace pulmonic valvular regurgitation.     **Great Vessels** The aortic root is normal size.    **Pericardium/Pleural** There is a possible trivial pericardial effusion near  the right atrium. There are no echocardiographic indications of cardiac  tamponade.    **Interpretation Summary** I have personally reviewed the study and confirm  the fellow's findings. Please see full report for findings. Study requested by  clinical service to assess for pericardial effusion in a patient who developed  pleuritic chest pain post Afib ablation. There is normal biventricular  function. There is a possible trivial pericardial effusion near the right  atrium. There are no echocardiographic indications of cardiac tamponade. No  prior transthoracic study is available for comparison. There is no significant  change compared to prior TEE of 01/17/2017.          Physical Examination    ---     _Exam_ :    General no apparent distress. HEENT PERRLA, EOMI, no JVD, carotid pulses 2+  bilaterally, no bruits. Cardiovascular regular rate and rhythm, clear S1 and  S2, no murmurs, no gallops, no rubs, no heaves, no thrills. Pulmonary clear to  auscultation and percussion, no wheezing, no rhonchi, no rales. Abdomen soft,  non tender, non distended, no organomegaly. Extremities warm, well perfused,  no edema, no clubbing, no cyanosis. Neurologic non focal.          Assessments    ---    1\. Atrial fibrillation - I48.91    ---      Treatment    ---       **1\. Atrial fibrillation**    Continue Flecainide Acetate Tablet, 100MG , Orally, twice daily    Continue Metoprolol Succinate ER Tablet Extended Release 24 Hour, 25 mg, 1  tablet, Orally, bid    Continue Metoprolol Tartrate Tablet, 25 mg, 1 tablet with food, Orally, prn    Continue Xarelto Tablet, 20 MG, 1 tablet with food, Orally, Once a day    ---      Follow Up    ---    3 Months    Electronically signed by Emma [name] , MD on 02/11/2017 at 08:24 AM EDT    Sign off status: Completed        * * *        [facility]    [address]    [place], [place] [address]    Tel: [phone]    Fax: [phone]              * * *          Patient: ELYSE, PREVO DOB: 23-Feb-1956 Progress Note: Emma Minors, MD  02/04/2017    ---    Note generated by eClinicalWorks EMR/PM Software ([email or web address])

## 2020-12-27 NOTE — Progress Notes (Signed)
* * *        **Emma [name]**    --- ---    74 Y old Female, DOB: April 29, 1956, External MRN: [id number]    Account Number: [id number]    [address], [place], [place]    Home: [phone]    Insurance: HMO BLUE OUT IPA Payer ID: PAPER    PCP: Emma Sayer, MD Referring: Emma [name] Depoo [name] External Visit ID:  [id number]    Appointment Facility: [facility]        * * *    05/13/2017  Progress Notes: Emma Minors, MD **CHN#:** [phone]    --- ---    ---        Current Medications    ---    Taking     * Flecainide Acetate 50 mg Tablet Orally twice daily    ---    * Furosemide 40 MG Once a day    ---    * Loratadine 10 MG Tablet 1 tablet Orally Once a day    ---    * Metoprolol Succinate ER 25 mg Tablet Extended Release 24 Hour 1 tablet Orally bid    ---    * Metoprolol Tartrate 25 mg Tablet 1 tablet with food Orally prn    ---    * Prevagen Extra Strength 20 MG Capsule Orally     ---    * Xarelto 20 MG Tablet 1 tablet with food Orally Once a day    ---    * Medication List reviewed and reconciled with the patient    ---      Past Medical History    ---       Paroxysmal Atrial fibrillation.        ---    Hypertension.        ---    Osteoarthritis.        ---    Obesity.        ---    Spinal Stenosis.        ---    OSA - on CPAP which she uses consistently.        ---      Surgical History    ---      Caesarian Section    ---    Rhinoplasty    ---    Knee Replacement    ---      Family History    ---      Father Deceased 81 years: CAD, MI, AAA    Mother: Deceased 42 years, died of CA: lymphoma, sarcoma with METS    Sister: CAD, HTN.    ---      Social History    ---      Quit Smoking in 1980s    No Illicit drugs.    ---      Allergies    ---      ace inhibitors    ---    [Allergies Verified]      Review of Systems    ---     _Cardiology - [facility]_ :    Constitutional \+ weight gain or loss in the past year, no night sweats or  fevers, no loss of appetite, no feelings of fatigue.  Cardiovascular no chest  discomfort or pain, \+ edema of the feet or ankles, no shortness of breath, \+  palpitations, no syncope, no presyncope. Ear/Nose/Mouth/Throat no hoarseness  or change in voice, no hearing difficulty or  ringing in ears, no nasal  congestion, no post-nasal drip, no nose bleeds, no loss of smell. Hematology  + easy bruising, no difficulty with stopping bleeding. Musculoskeletal no  joint pain or swelling, no back pain, no arm or leg weakness.  Neurology/Psychiatry no dizziness or lightheadedness, no unsteady gait, no  numbness or tingling sensation, no headaches, no forgetfulness, no anxious or  nervous feelings, no insomnia, no feeling of being too sleepy all the time.  Respiratory no cough, no sputum or phlegm production, no blood in sputum, no  difficulty breathing with activity or rest, no wheezing, no loud snoring.            Reason for Appointment    ---      1\. Atrial fibrillation, s/p PVI 01/18/17    ---      History of Present Illness    ---     _Cardiology_ :    I had the pleasure of seeing [name] on follow up at [facility] on Monday 05/13/2017 in the company of her  husband.    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 After failing flecainide, she was referred for ablation of her  paroxysmal atrial fibrillation. The risk factors she has are obesity, sleep  apnea, and hypertension. On 01/18/2017, she underwent pulmonary vein isolation  using cryoablation. There was some difficulty in isolating the left superior  pulmonary vein. Even after it was isolated pacing or mechanical trauma to the  left atrium led to the precipitation of atrial fibrillation. In light of this,  she was advised to resume the flecainide that she had been on prior to the  ablation, albeit at higher dose. She had been on 50 mg twice a day, she  was  hospitalized for 2 days during which her dose of flecainide was raised to 100  mg twice a day. She tolerated this well and in fact converted spontaneously  back into sinus rhythm after organizing into an atrial tachycardia.    Today Emma [name] reports that she has been feeling well overall. She did  have an incident after a few stressful days early this past Saturday morning  (05/11/17) around 3am of chest pressure and palpitations. She felt her pulse  and it was not rapid. She remained awake for a while with it then took  benadryl and ibuprofen before going back to sleep. When she awoke, her  symptoms had resolved. This was the only incident of symptoms since her  ablation. She has OSA and is compliant with CPAP. Since she was getting some  vertigo on 100mg  BID flecainide, it has been decreased it to 50mg  BID.     _Associated Providers_ :    Primary Care Provider Emma [name]. Referring Providers Emma [name].      Vital Signs    ---    Pain scale 0, Ht-in 65, Wt-lbs 329, BMI 54.74, BP 150/76, HR 60, RR 16, BSA  2.61, O2 97, Wt-kg 149.23, Wt Change -1 lb.      Examination    ---     _Cardiac Testing_ :    EKG: (05/13/2017 ): SR 61 bpm, IVCD 100 ms., QTc 448 ms.    Echo: ****    **01/21/2017    Left Ventricle **The left ventricle is normal in size. Mildly increased left  ventricular  wall thickness. Left ventricular systolic function is normal.  Ejection Fraction = 55%. No regional wall motion abnormalities noted.    **Right Ventricle** The right ventricle is mildly dilated. The right  ventricular systolic function is low normal.    **Atria** The left atrium is moderately dilated. The right atrium is mild to  moderately dilated. The inferior vena cava is normal size with > 50%  respirophasic variation, consistent with normal right atrial pressure.    **Mitral Valve** The mitral valve is normal. There is mild mitral annular  calcification. There is trace mitral regurgitation.    **Tricuspid  Valve** The tricuspid valve is not well visualized, but is grossly  normal. There is trace tricuspid regurgitation.    **Aortic Valve** The aortic valve is mildly thickened. The aortic valve is  trileaflet. The aortic valve opens well. Trace aortic regurgitation.    **Pulmonic Valve** The pulmonic valve is not well seen, but is grossly normal.  Trace pulmonic valvular regurgitation.    **Great Vessels** The aortic root is normal size.    **Pericardium/Pleural** There is a possible trivial pericardial effusion near  the right atrium. There are no echocardiographic indications of cardiac  tamponade.    **Interpretation Summary** I have personally reviewed the study and confirm  the fellow's findings. Please see full report for findings. Study requested by  clinical service to assess for pericardial effusion in a patient who developed  pleuritic chest pain post Afib ablation. There is normal biventricular  function. There is a possible trivial pericardial effusion near the right  atrium. There are no echocardiographic indications of cardiac tamponade. No  prior transthoracic study is available for comparison. There is no significant  change compared to prior TEE of 01/17/2017.          Physical Examination    ---     _Exam_ :    General no apparent distress. HEENT PERRLA, EOMI, no JVD, carotid pulses 2+  bilaterally, no bruits. Cardiovascular regular rate and rhythm, clear S1 and  S2, no murmurs, no gallops, no rubs, no heaves, no thrills. Pulmonary clear to  auscultation and percussion, no wheezing, no rhonchi, no rales. Abdomen soft,  non tender, non distended, no organomegaly. Extremities warm, well perfused,  no edema, no clubbing, no cyanosis. Neurologic non focal.          Assessments    ---    1\. Atrial fibrillation - I48.91 (Primary), By her own, assessment, this is  the longest period of time that she has been free of atrial fibrillation. I  was concerned post ablation about the long-term possibility of her  remaining  in sinus rhythm given her obesity and obstructive sleep apnea. However, she  appears to have remained in sinus rhythm with no recurrence of her once  frequent paroxysmal atrial fibrillation. The long-term result of her PVI  remains to be determined. I suspect that there will be a recurrence. Hence, I  have maintained her on flecainide at the same dose she had been on prior to  the ablation 50 mg twice a day.    ---    2\. 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 She may need the addition of an ACEI/ARB. More  important, I anticipate that with weight loss and better control of her sleep  apnea, her blood pressure will fall under better  control    ---      Treatment    ---       **1\. Atrial fibrillation**    Continue Flecainide Acetate Tablet, 50 mg, Orally, twice daily    Continue Metoprolol Succinate ER Tablet Extended Release 24 Hour, 25 mg, 1  tablet, Orally, bid    Continue Metoprolol Tartrate Tablet, 25 mg, 1 tablet with food, Orally, prn    Continue Xarelto Tablet, 20 MG, 1 tablet with food, Orally, Once a day    ---         **2\. Hypertension**    Continue Furosemide 40 MG, Once a day      Follow Up    ---    6 Months    Electronically signed by Emma [name] , MD on 05/17/2017 at 11:44 AM EDT    Sign off status: Completed        * * *        [name]    [address]    [place], [place] [address]    Tel: [phone]    Fax: [phone]              * * *          Patient: Emma [name], Emma [name] DOB: 08-13-1956 Progress Note: Emma Minors, MD  05/13/2017    ---    Note generated by eClinicalWorks EMR/PM Software ([email or web address])

## 2020-12-27 NOTE — Progress Notes (Signed)
* * *        **  [name]    --- ---    65 Y old Female, DOB: July 26, 1956    [address] [place], [place] [address]    Home: [phone]    Provider: [name], MD        * * *    Telephone Encounter    ---    Answered by   Emi Holes  Date: 04/01/2018         Time: 10:54 AM    Reason   lasix dose    --- ---            Action Taken                      SHEPARD,PATRICIA , RN 04/01/2018 10:55:20 AM > s/w with patient to clarify lasix order. She is taking 40 mg daily. Med list corrected.                    * * *              * * *        ---        Reason for Appointment    ---      1\. Lasix dose    ---      Current Medications    ---    Taking     * furosemide 40 mg tablet 1 tab(s) orally daily    ---    Unknown    * loratadine 10 mg tablet 1 tab(s) orally once a day    ---    * Tylenol Extra Strength 500 mg tablet 2 tab(s) orally PRN    ---    * flecainide 50 mg tablet 1 tab(s) orally B.I.D.    ---    * Metoprolol Tartrate 25 mg tablet 1 tab(s) orally once daily as needed    ---    * Toprol-XL 25 mg tablet, extended release 1 tab(s) orally B.I.D.    ---    * Xarelto 20 mg tablet 1 tab(s) orally once a day (in the evening)    ---    * Medication List reviewed and reconciled with the patient    ---          * * *          Patient: MAKAILEY, HODGKIN A DOB: November 14, 1955 Provider: [name],  MD 04/01/2018    ---    Note generated by eClinicalWorks EMR/PM Software ([email or web address])

## 2020-12-27 NOTE — Progress Notes (Signed)
* * *        **  Emma [name]    --- ---    83 Y old Female, DOB: 07-24-1956    [name] [name] [place], [place] [address]    Home: [phone]    Provider: [name], MD        * * *    Telephone Encounter    ---    Answered by   Mary Sella  Date: 07/30/2017         Time: 10:29 AM    Reason   no show NP .    --- ---            Message                      r/s from 10/25--Gina                Action Taken   Carlson,Dyann 07/30/2017 4:27:37 PM > LMOM to return my call  Carlson,Dyann 07/30/2017 5:00:04 PM > r/s 09/03/17                * * *                ---          * * *          Patient: Emma [name] DOB: 1955-11-08 Provider: [name],  MD 07/30/2017    ---    Note generated by eClinicalWorks EMR/PM Software ([email or web address])

## 2020-12-27 NOTE — Progress Notes (Signed)
* * *        **  Emma [name]    --- ---    56 Y old Female, DOB: [birth year]    [address], [address], [place], [place] [address]    Home: [phone]    Provider: [name], MD        * * *    Telephone Encounter    ---    Answered by   Mitchel Honour  Date: 08/02/2016         Time: 10:47 AM    Message                      Please arrange for outpatient consult with Dr. Malcolm Metro at [facility] for AF ablation, call her next week.  Patient is admitted at [facility] ([facility]).  Also needs hosp f/u w Dr. Audley Hose in 1 mo        --- ---            Action Taken   [name],[name] 08/03/2016 9:15:31 AM > called Dr Malcolm Metro at [facility]  appt is November 27-17 at 3:00pm pt medical record # is [id number] and pt has to  call registration at [phone] [name],[name] 08/03/2016 9:41:08 AM > also  made HFU 09-12-16 at 3:15 called pt LMOM                * * *                ---          * * *          Patient: [name] DOB: 10/25/1955 Provider: [name],  MD 08/02/2016    ---    Note generated by eClinicalWorks EMR/PM Software ([email or web address])

## 2020-12-27 NOTE — Progress Notes (Signed)
* * *        Emma [name]**    --- ---    80 Y old Female, DOB: [birth year], External MRN: [id number]    Account Number: [id number]    [address], [address], [place], [place]    Home: [phone]    Insurance: HMO BLUE OUT IPA Payer ID: PAPER    PCP: Mardene Sayer, MD Referring: Nile Dear [facility] External Visit ID:  [id number]    Appointment Facility: [facility]        * * *    08/27/2016  Progress Notes: Guerry Minors, MD **CHN#:** [phone]    --- ---    ---        Current Medications    ---    Taking     * Flecainide Acetate 50 mg Tablet Orally twice daily    ---    * Hydrochlorothiazide 25 MG Tablet 1 tablet in the morning Orally Once a day    ---    * Loratadine 10 MG Tablet 1 tablet Orally Once a day    ---    * Losartan Potassium 50 mg Tablet 1 tablet Orally bid    ---    * Metoprolol Succinate ER 25 mg Tablet Extended Release 24 Hour 1 tablet Orally bid    ---    * Metoprolol Tartrate 25 mg Tablet 1 tablet with food Orally prn    ---    * Prevagen Extra Strength 20 MG Capsule Orally     ---    * Xarelto 20 MG Tablet 1 tablet with food Orally Once a day    ---      Past Medical History    ---       Paroxysmal Atrial fibrillation.        ---    Hypertension.        ---    Osteoarthritis.        ---    Obesity.        ---    Spinal Stenosis.        ---    OSA - on CPAP which she uses consistently.        ---      Surgical History    ---      Caesarian Section    ---    Rhinoplasty    ---    Knee Replacement    ---      Family History    ---      Father Deceased 67 years: CAD, MI, AAA    Mother: Deceased 30 years, died of CA: lymphoma, sarcoma with METS    Sister: CAD, HTN.    ---      Social History    ---      Quit Smoking in 1980s    No Illicit drugs.    ---      Allergies    ---      ace inhibitors    ---      Review of Systems    ---     _CardioVascular_ :    General/Constitutional no fever, chills, night sweats, sleep disturbance,  headaches, no change in appetite, no  fatigue, no weight gain/loss.  Gastrointestinal no melena, no hematochezia, hematemses, no blood in stool,  nausea, vomiting, diarrhea, abdominal pain. Peripheral Vascular no  claudication, no skin ulceration. Endocrine no polydipsia, polyphagia, hot and  cold intolerance. Respiratory no cough, wheezing, shortness of breath  at rest,  sore throat, upper airway congestion, or hemoptysis. Genitourinary no blood in  urine. Musculoskeletal no arthraglias, myalgias, or muscle aches.            Reason for Appointment    ---      1\. Atrial fibrillation    ---      History of Present Illness    ---     _Cardiology_ :    Thank you for referring Emma [name] for evaluation and management of  her paroxysmal atrial fibrillation. We had the pleasure of seeing Emma [name]  the company of her husband at the [place] [facility] on 08/27/16.    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 She was  initially diagnosed with atrial fibrillation about 3 years ago. She developed  palpitations and chest pain during the night and was noted to have AFi with  RVR on presentation to the ER at [facility]. She was started on a Cardizem  drip at that time and spontaneously cardioverter. She had 3 episodes that year  each treated similarly. After the 3 rd episode she was started on metoprolol  and flecainide after a normal stress test and ECHO. She did well for 3 years  with no recurrences however in [place] she had a recurrence. again she was  treated with IV Cardizem and cardioverted. She had another episode in May but  did not go to the ER, she instead took an extra dose of flecainide and  cardioverted at home. She was subsequently given PRN short acting metoprolol  for this purpose. On Halloween night, October 30 th she had another episode  while  working the late shift. She took an extra a dose of short acting  metoprolol however it did not cardiovert her after 30 minutes so she went to  the ER where she was noted to have AF with a heart rate in the 160's. She was  given Flecainide 300 mg after which her atrial fibrillation broke into a  junctional rhythm with a slow ventricular rate requiring atropine and IV  dopamine before sinus rhythm was restored.    She presents now for consideration for an ablation. She has OSA and is  compliant with CPAP. She notes that over the past 3 years since starting  flecainide she has gained about 50 lbs. She is very inactive and does not  exercise. She does not drink alcohol, she has significantly reduced her coffee  intake. No illicit drugs.      Vital Signs    ---    Pain scale 0, Ht-in 65, Wt-lbs 325, BMI 54.08, BP 145/89, HR 61, SaO2 95%.      Physical Examination    ---     _General Examination_ :    General Appearance well developed, well nourished, alert, oriented, in no  acute distress. Oral Cavity mucosa moist. Skin no rashes. Heart regular rate  and rhythm, normal S1, normal S2. Lungs clear to auscultation bilaterally with  no wheezing, no crackles, no rhonchi. Chest no costochondral tenderness.  Abdomen soft, non tender, non distended, bowel sounds present, no abdominal or  flank bruits, no organomegaly, no prominent pulsation, no bruits. Neurological  nonfocal, alert and oriented, no gross focal deficits.          Assessments    ---  1\. Atrial fibrillation - I48.91 (Primary)    ---      66 year old female patient with paroxysmal atrial fibrillation previously  controlled on flecainide and metoprolol. She has had recurrences recently  despite compliance with medical therapy. Emma [name] is a good candidate for  pulmonary vein isolation due to the paroxysmal nature of her highly  symptomatic paroxysmal atrial fibrillation. We had an extensive discussion  with her and with her husband about the rationale behind  recommending  pulmonary vein isolation, the risks and the potential benefits. Unfortunately  her morbid obesity would significantly lower the long-term success of this  procedure. In order to improve chances of a successful ablation we recommend  weight loss. She is very receptive to this and already has a plan in mind  including diet and increased activity. The other mitigating factor against a  long-term successful outcome is obstructive sleep apnea. On the other hand,  she is compliant with her CPAP which in and of itself enhances the long-term  success of PVI. Were reviewed with both Mrs. and Mr. [name] the risks of a  stroke, right phrenic nerve paralysis, vascular injury, injury to the  esophagus as well as musculoskeletal discomfort related to lying on her back  during the procedure and during recovery given her morbid obesity.    We also recommend an ECHO and a stress test since it has been 3 years since  she has been on flecainide. This will be done at Dr. Aundria Mems office. We will  see her again towards the end of January 2018. We will tentatively schedule  her for an ablation in February, which, fortuitously, is the earliest  available opening for an AF ablation with the optimism that she will achieve  meaningful weight loss by then. We would ask her to continue the Xarelto  uninterrupted but hold the flecainide a week before the anticipated date of  the ablation. She will have a TEE and a CT scan of her heart the day before  the procedure.    ---      Treatment    ---       **1\. Atrial fibrillation**    Continue Flecainide Acetate Tablet, 50 mg, Orally, twice daily    Continue Metoprolol Succinate ER Tablet Extended Release 24 Hour, 25 mg, 1  tablet, Orally, bid    Continue Metoprolol Tartrate Tablet, 25 mg, 1 tablet with food, Orally, prn    Continue Xarelto Tablet, 20 MG, 1 tablet with food, Orally, Once a day    ---      Follow Up    ---    2 Months    Electronically signed by Guerry Minors , MD on  08/28/2016 at 11:22 AM EST    Sign off status: Completed        * * *        [facility]    [address]    [place], [place] [address]    Tel: [phone]    Fax: [phone]              * * *          Patient: Emma [name], Emma [name] DOB: 20-Oct-1955 Progress Note: Guerry Minors, MD  08/27/2016    ---    Note generated by eClinicalWorks EMR/PM Software ([email or web address])

## 2020-12-27 NOTE — Progress Notes (Signed)
* * *        **  Emma [name]    --- ---    50 Y old Female, DOB: 1956/01/23    [address], [name], [place], [place] [address]    Home: [phone]    Provider: [name], MD        * * *    Telephone Encounter    ---    Answered by   Mary Sella  Date: 01/01/2017         Time: 07:51 AM    Reason   no show .    --- ---            Message                      r/s from 3/22                Action Taken   Carlson,Dyann 01/01/2017 2:35:41 PM > LMOM to return my call  Carlson,Dyann 01/01/2017 2:41:40 PM > Pt. does not want to make an appt. at this  time.                * * *                ---          * * *          Patient: Emma [name], Emma [name] DOB: 07-05-1956 Provider: [name],  MD 01/01/2017    ---    Note generated by eClinicalWorks EMR/PM Software ([email or web address])

## 2021-12-24 LAB — HX STOOL CARDS

## 2022-07-06 DIAGNOSIS — Z9889 Other specified postprocedural states: Secondary | ICD-10-CM

## 2023-03-22 ENCOUNTER — Other Ambulatory Visit: Payer: Self-pay

## 2023-03-22 DIAGNOSIS — H903 Sensorineural hearing loss, bilateral: Secondary | ICD-10-CM

## 2023-03-22 DIAGNOSIS — H9312 Tinnitus, left ear: Secondary | ICD-10-CM

## 2023-08-07 ENCOUNTER — Emergency Department (HOSPITAL_COMMUNITY): Payer: Medicare Other

## 2023-08-07 ENCOUNTER — Other Ambulatory Visit: Payer: Self-pay

## 2023-08-07 ENCOUNTER — Encounter (HOSPITAL_COMMUNITY): Payer: Self-pay | Admitting: Emergency Medicine

## 2023-08-07 ENCOUNTER — Emergency Department (HOSPITAL_COMMUNITY)
Admission: EM | Admit: 2023-08-07 | Discharge: 2023-08-08 | Disposition: A | Discharge status: Home / Self Care | Payer: Medicare Other | Attending: Emergency Medicine | Admitting: Emergency Medicine

## 2023-08-07 DIAGNOSIS — W2201XA Walked into wall, initial encounter: Secondary | ICD-10-CM | POA: Insufficient documentation

## 2023-08-07 DIAGNOSIS — M25562 Pain in left knee: Secondary | ICD-10-CM | POA: Diagnosis present

## 2023-08-07 DIAGNOSIS — M25569 Pain in unspecified knee: Secondary | ICD-10-CM | POA: Diagnosis not present

## 2023-08-07 DIAGNOSIS — M25561 Pain in right knee: Secondary | ICD-10-CM | POA: Diagnosis present

## 2023-08-07 DIAGNOSIS — W19XXXA Unspecified fall, initial encounter: Secondary | ICD-10-CM

## 2023-08-07 DIAGNOSIS — Z96653 Presence of artificial knee joint, bilateral: Secondary | ICD-10-CM | POA: Insufficient documentation

## 2023-08-07 DIAGNOSIS — W01198A Fall on same level from slipping, tripping and stumbling with subsequent striking against other object, initial encounter: Secondary | ICD-10-CM | POA: Insufficient documentation

## 2023-08-07 DIAGNOSIS — S0990XA Unspecified injury of head, initial encounter: Secondary | ICD-10-CM | POA: Insufficient documentation

## 2023-08-07 DIAGNOSIS — R112 Nausea with vomiting, unspecified: Secondary | ICD-10-CM | POA: Insufficient documentation

## 2023-08-07 DIAGNOSIS — Z7901 Long term (current) use of anticoagulants: Secondary | ICD-10-CM | POA: Diagnosis not present

## 2023-08-07 LAB — BASIC METABOLIC PANEL
Anion gap: 10 (ref 5–15)
BUN: 15 mg/dL (ref 8–23)
CO2: 25 mmol/L (ref 22–32)
Calcium: 9.3 mg/dL (ref 8.9–10.3)
Chloride: 106 mmol/L (ref 98–111)
Creatinine, Ser: 0.81 mg/dL (ref 0.44–1.00)
GFR, Estimated: >60 mL/min (ref >60)
Glucose, Bld: 113 mg/dL — ABNORMAL HIGH (ref 70–99)
Potassium: 3.8 mmol/L (ref 3.5–5.1)
Sodium: 141 mmol/L (ref 135–145)

## 2023-08-07 LAB — CBC WITH DIFFERENTIAL/PLATELET
Abs Immature Granulocytes: 0.04 10*3/uL (ref 0.00–0.07)
Basophils Absolute: 0 10*3/uL (ref 0.0–0.1)
Basophils Relative: 1 %
Eosinophils Absolute: 0.3 10*3/uL (ref 0.0–0.5)
Eosinophils Relative: 3 %
HCT: 40.9 % (ref 36.0–46.0)
Hemoglobin: 13.7 g/dL (ref 12.0–15.0)
Immature Granulocytes: 1 %
Lymphocytes Relative: 14 %
Lymphs Abs: 1.2 10*3/uL (ref 0.7–4.0)
MCH: 31.1 pg (ref 26.0–34.0)
MCHC: 33.5 g/dL (ref 30.0–36.0)
MCV: 92.7 fL (ref 80.0–100.0)
Monocytes Absolute: 0.8 10*3/uL (ref 0.1–1.0)
Monocytes Relative: 9 %
Neutro Abs: 6.1 10*3/uL (ref 1.7–7.7)
Neutrophils Relative %: 72 %
Platelets: 269 10*3/uL (ref 150–400)
RBC: 4.41 MIL/uL (ref 3.87–5.11)
RDW: 13 % (ref 11.5–15.5)
WBC: 8.5 10*3/uL (ref 4.0–10.5)
nRBC: 0 % (ref 0.0–0.2)

## 2023-08-07 MED ORDER — ONDANSETRON HCL 4 MG/2ML IJ SOLN
4.0000 mg | Freq: Once | INTRAMUSCULAR | Status: AC
  Administered 2023-08-07: 4 mg via INTRAVENOUS
  Filled 2023-08-07: qty 2

## 2023-08-07 NOTE — ED Provider Notes (Incomplete)
  [facility] Emergency Department Provider Note MRN:  [id number]  Arrival date & time: 08/07/23     Chief Complaint   No chief complaint on file.   History of Present Illness   Amy Hanna is a 67 y.o. year-old female presents to the ED with chief complaint of ***.  {rbhistorian:27070} {RB interpreter (Optional):27221}  Review of Systems  Pertinent positive and negative review of systems noted in HPI.    Physical Exam   Vitals:   08/07/23 2302 08/07/23 2303  BP:    Pulse:  65  Resp:  16  Temp: 97.7 F (36.5 C)   SpO2:  90%    CONSTITUTIONAL:  ***-appearing, NAD NEURO:  Alert and oriented x 3, CN 3-12 grossly intact*** EYES:  eyes equal and reactive ENT/NECK:  Supple, no stridor *** CARDIO:  ***, ***regular rhythm, appears well-perfused *** PULM:  No respiratory distress***, *** GI/GU:  non-distended, *** MSK/SPINE:  No gross deformities, no edema, moves all extremities *** SKIN:  no rash, atraumatic***   *Additional and/or pertinent findings included in MDM below  Diagnostic and Interventional Summary    EKG Interpretation Date/Time:    Ventricular Rate:    PR Interval:    QRS Duration:    QT Interval:    QTC Calculation:   R Axis:      Text Interpretation:        *** Labs Reviewed  CBC WITH DIFFERENTIAL/PLATELET  BASIC METABOLIC PANEL    CT HEAD WO CONTRAST ( )    (Results Pending)  CT Cervical Spine Wo Contrast    (Results Pending)  DG Knee Complete 4 Views Left    (Results Pending)  DG Knee Complete 4 Views Right    (Results Pending)    Medications - No data to display   Procedures  /  Critical Care Procedures  ED Course and Medical Decision Making  I have reviewed the triage vital signs, the nursing notes, and pertinent available records from the EMR.  Social Determinants Affecting Complexity of Care: Patient {rbSocial Determinants:27067}. {rbsocialsolutions:27068}  ED Course:    Medical Decision  Making Amount and/or Complexity of Data Reviewed Labs: ordered. Radiology: ordered.      {rbcpddx (Optional):29772:::1} {rbabdddx (Optional):29773:s::1}  Consultants: {rbconsultants:27072}   Treatment and Plan: {rbadmissionvdc:27069}  {rbattending:27073}  Final Clinical Impressions(s) / ED Diagnoses  No diagnosis found.  ED Discharge Orders     None         Discharge Instructions Discussed with and Provided to Patient:   Discharge Instructions   None

## 2023-08-07 NOTE — ED Triage Notes (Signed)
Patient BIB [facility]. Patient with mechanical fall this evening. Larey Seat forward landed on both knees, struck head on wall during fall. Patient complaint of nausea and vomiting following the fall. VSS. NAD.

## 2023-08-07 NOTE — ED Notes (Signed)
Returned from CT with this Charity fundraiser. Pt complaining of nausea. Provider aware and orders placed.

## 2023-08-07 NOTE — ED Notes (Signed)
Pt' s o2 sats decreased to 87% on roomair. Pt placed on 2L of O2 and sats increasedto 93%. Pt's dtr, Raynelle Fanning, at bedside

## 2023-08-07 NOTE — ED Notes (Signed)
Trauma Response Nurse Documentation   Amy Hanna is a 67 y.o. female arriving to [facility] ED via [facility]  On Xarelto (rivaroxaban) daily. Trauma was activated as a Level 2 by Eston Esters based on the following trauma criteria Elderly patients > 65 with head trauma on anti-coagulation (excluding ASA).  Patient cleared for CT by Dr. Durwin Nora. Pt transported to CT with trauma response nurse present to monitor. RN remained with the patient throughout their absence from the department for clinical observation.   GCS 15.  History   Past Medical History:  Diagnosis Date   History of diastolic dysfunction    Hyperlipidemia    Hypertension    Obesity, morbid (HCC)    Obstructive sleep apnea    uses CPAP   Paroxysmal atrial fibrillation (HCC)    Sinus bradycardia      Past Surgical History:  Procedure Laterality Date   ATRIAL FIBRILLATION ABLATION  12/2015   [facility]   CARDIAC CATHETERIZATION     normal coronary arteries   CESAREAN SECTION     REPLACEMENT TOTAL KNEE BILATERAL         Initial Focused Assessment (If applicable, or please see trauma documentation): Airway-- intact, no visible obstruction Breathing-- spontaneous, unlabored Circulation-- no obvious bleeding noted on exam  CT's Completed:   CT Head and CT C-Spine   Interventions:  See event summary  Plan for disposition:  {Trauma Dispo:26867}   Consults completed:  {Trauma Consults:26862} at ***.  Event Summary: Patient brought in by [facility]. Patient with mechanical fall this evening. Fell down to the ground onto both knees, striking her head on the wall. Patient endorses bilateral knee pain and nausea/vomiting since striking her head. Patient transferred from EMS stretcher to hospital stretcher. 20 G PIV established, lab work obtained. Patient to CT with Primary RN, TRN. CT head and c-spine completed. Patient back to exam room. Xray right and left knee  completed.  MTP Summary (If applicable):  N/A  Bedside handoff with ED RN Shawn.    Leota Sauers  Trauma Response RN  Please call TRN at [phone] for further assistance.

## 2023-08-07 NOTE — ED Provider Notes (Signed)
[facility] Emergency Department Provider Note MRN:  [id number]  Arrival date & time: 08/08/23     Chief Complaint   Fall   History of Present Illness   Amy Hanna is a 67 y.o. year-old female presents to the ED with chief complaint of fall.  Patient brought in as a level 2 trauma.  She takes Xarelto.  She had a mechanical fall tonight in which she fell forward onto her knees and also hit the right side of her head against the wall.  She did not pass out.  She reports associated nausea.  The fall occurred at around 8:00 PM.  She complains of some pain in her knees.  History of knee replacement.  Denies any other injuries.  She denies any chest pain or abdominal pain.  Denies any numbness, weakness, tingling, seizure, slurred speech, or loss of vision.Marland Kitchen  History provided by patient.   Review of Systems  Pertinent positive and negative review of systems noted in HPI.    Physical Exam   Vitals:   08/08/23 0015 08/08/23 0039  BP: 114/64 119/64  Pulse: (!) 56 (!) 58  Resp: 16 16  Temp:  97.8 F (36.6 C)  SpO2: 93% 94%    CONSTITUTIONAL:  well-appearing, NAD NEURO:  Alert and oriented x 3, CN 3-12 grossly intact, GCS 15 EYES:  eyes equal and reactive ENT/NECK:  Supple, no stridor  CARDIO:  normal rate, regular rhythm, appears well-perfused  PULM:  No respiratory distress, CTAB GI/GU:  non-distended, no tenderness MSK/SPINE:  No gross deformities, no edema, moves all extremities  SKIN:  no rash, atraumatic   *Additional and/or pertinent findings included in MDM below  Diagnostic and Interventional Summary    EKG Interpretation Date/Time:    Ventricular Rate:    PR Interval:    QRS Duration:    QT Interval:    QTC Calculation:   R Axis:      Text Interpretation:         Labs Reviewed  BASIC METABOLIC PANEL - Abnormal; Notable for the following components:      Result Value   Glucose, Bld 113 (*)    All other components within  normal limits  CBC WITH DIFFERENTIAL/PLATELET    DG Knee Complete 4 Views Right  Final Result    DG Knee Complete 4 Views Left  Final Result    CT HEAD WO CONTRAST ( )  Final Result    CT Cervical Spine Wo Contrast  Final Result      Medications  ondansetron (ZOFRAN) injection 4 mg (4 mg Intravenous Given 08/07/23 2324)     Procedures  /  Critical Care Procedures  ED Course and Medical Decision Making  I have reviewed the triage vital signs, the nursing notes, and pertinent available records from the EMR.  Social Determinants Affecting Complexity of Care: Patient has no clinically significant social determinants affecting this chief complaint..   ED Course: Clinical Course as of 08/08/23 0125  Thu Aug 08, 2023  0124 Basic metabolic panel(!) No significant electrolyte derangement [RB]  0124 CBC with Differential No anemia or leukocytosis [RB]  0124 CT HEAD WO CONTRAST ( ) No ICH seen [RB]    Clinical Course User Index [RB] Roxy Horseman, PA-C    Medical Decision Making Patient BIB EMS as a level 2 trauma, ATLS protocol followed. A: intact, speaking B: intact, no respiratory distress C: intact, normal distal pulses, BP stable D: Head injury on Eliquis, will get CT.  No laceration, hematoma, battle or raccoon's sign E: none  Secondary survey:  No other additional injuries found.  No cervical spine tenderness.  Able to ambulate.   Patient reassessed.  She states that she feels ready to go home.  Imaging is all reassuring.  Amount and/or Complexity of Data Reviewed Labs: ordered. Radiology: ordered.  Risk Prescription drug management.         Consultants: No consultations were needed in caring for this patient.   Treatment and Plan: I considered admission due to patient's initial presentation, but after considering the examination and diagnostic results, patient will not require admission and can be discharged with outpatient  follow-up.    Final Clinical Impressions(s) / ED Diagnoses     ICD-10-CM   1. Fall, initial encounter  W19.XXXA     2. Injury of head, initial encounter  S09.90XA       ED Discharge Orders          Ordered    ondansetron (ZOFRAN-ODT) 4 MG disintegrating tablet  Every 8 hours PRN        08/08/23 0106              Discharge Instructions Discussed with and Provided to Patient:     Discharge Instructions      Your tests looked good today. Please return for new or worsening symptoms.  I've sent a prescription for nausea medicine (zofran) to your pharmacy.  It is important to get plenty of rest and avoid screen time as much as possible while recovering.       Roxy Horseman, PA-C 08/08/23 0125    Sabas Sous, MD 08/08/23 0700

## 2023-08-08 MED ORDER — ONDANSETRON 4 MG PO TBDP: 4.0000 mg | ORAL_TABLET | Freq: Three times a day (TID) | ORAL | 0 refills | Status: DC | PRN

## 2023-08-08 NOTE — Discharge Instructions (Addendum)
Your tests looked good today. Please return for new or worsening symptoms.  I've sent a prescription for nausea medicine (zofran) to your pharmacy.  It is important to get plenty of rest and avoid screen time as much as possible while recovering.

## 2023-08-08 NOTE — Progress Notes (Signed)
Orthopedic Tech Progress Note Patient Details:  Amy Hanna [birth year] [id number]  Level 2 trauma  Patient ID: Amy Hanna, female   DOB: May 15, 1956, 67 y.o.   MRN: [id number]  Amy Hanna 08/08/2023, 12:43 AM

## 2023-08-08 NOTE — ED Notes (Signed)
Pt ambulated with home cane without assistance. Pt reports that she feels "good".

## 2023-11-18 ENCOUNTER — Observation Stay (HOSPITAL_COMMUNITY)
Admission: EM | Admit: 2023-11-18 | Discharge: 2023-11-20 | Disposition: A | Discharge status: Home / Self Care | Payer: Medicare Other | Attending: Internal Medicine | Admitting: Internal Medicine

## 2023-11-18 ENCOUNTER — Encounter (HOSPITAL_COMMUNITY): Payer: Self-pay

## 2023-11-18 ENCOUNTER — Other Ambulatory Visit: Payer: Self-pay

## 2023-11-18 DIAGNOSIS — I480 Paroxysmal atrial fibrillation: Secondary | ICD-10-CM | POA: Diagnosis present

## 2023-11-18 DIAGNOSIS — Z87891 Personal history of nicotine dependence: Secondary | ICD-10-CM | POA: Insufficient documentation

## 2023-11-18 DIAGNOSIS — G4733 Obstructive sleep apnea (adult) (pediatric): Secondary | ICD-10-CM | POA: Diagnosis not present

## 2023-11-18 DIAGNOSIS — Z9862 Peripheral vascular angioplasty status: Secondary | ICD-10-CM | POA: Insufficient documentation

## 2023-11-18 DIAGNOSIS — Z86718 Personal history of other venous thrombosis and embolism: Secondary | ICD-10-CM

## 2023-11-18 DIAGNOSIS — Z96653 Presence of artificial knee joint, bilateral: Secondary | ICD-10-CM | POA: Insufficient documentation

## 2023-11-18 DIAGNOSIS — I110 Hypertensive heart disease with heart failure: Secondary | ICD-10-CM | POA: Diagnosis not present

## 2023-11-18 DIAGNOSIS — R001 Bradycardia, unspecified: Secondary | ICD-10-CM | POA: Diagnosis present

## 2023-11-18 DIAGNOSIS — Z7901 Long term (current) use of anticoagulants: Secondary | ICD-10-CM

## 2023-11-18 DIAGNOSIS — K625 Hemorrhage of anus and rectum: Principal | ICD-10-CM

## 2023-11-18 DIAGNOSIS — Z9889 Other specified postprocedural states: Secondary | ICD-10-CM

## 2023-11-18 DIAGNOSIS — I5032 Chronic diastolic (congestive) heart failure: Secondary | ICD-10-CM | POA: Diagnosis present

## 2023-11-18 DIAGNOSIS — K922 Gastrointestinal hemorrhage, unspecified: Secondary | ICD-10-CM | POA: Diagnosis present

## 2023-11-18 DIAGNOSIS — K5790 Diverticulosis of intestine, part unspecified, without perforation or abscess without bleeding: Secondary | ICD-10-CM

## 2023-11-18 DIAGNOSIS — I10 Essential (primary) hypertension: Secondary | ICD-10-CM | POA: Diagnosis present

## 2023-11-18 LAB — COMPREHENSIVE METABOLIC PANEL
ALT: 22 U/L (ref 0–44)
AST: 24 U/L (ref 15–41)
Albumin: 3.6 g/dL (ref 3.5–5.0)
Alkaline Phosphatase: 69 U/L (ref 38–126)
Anion gap: 10 (ref 5–15)
BUN: 17 mg/dL (ref 8–23)
CO2: 24 mmol/L (ref 22–32)
Calcium: 9.5 mg/dL (ref 8.9–10.3)
Chloride: 105 mmol/L (ref 98–111)
Creatinine, Ser: 0.6 mg/dL (ref 0.44–1.00)
GFR, Estimated: >60 mL/min (ref >60)
Glucose, Bld: 97 mg/dL (ref 70–99)
Potassium: 3.7 mmol/L (ref 3.5–5.1)
Sodium: 139 mmol/L (ref 135–145)
Total Bilirubin: 0.7 mg/dL (ref 0.0–1.2)
Total Protein: 6.8 g/dL (ref 6.5–8.1)

## 2023-11-18 LAB — CBC
HCT: 41.5 % (ref 36.0–46.0)
Hemoglobin: 14 g/dL (ref 12.0–15.0)
MCH: 31.3 pg (ref 26.0–34.0)
MCHC: 33.7 g/dL (ref 30.0–36.0)
MCV: 92.8 fL (ref 80.0–100.0)
Platelets: 298 10*3/uL (ref 150–400)
RBC: 4.47 MIL/uL (ref 3.87–5.11)
RDW: 12.6 % (ref 11.5–15.5)
WBC: 5.4 10*3/uL (ref 4.0–10.5)
nRBC: 0 % (ref 0.0–0.2)

## 2023-11-18 NOTE — ED Triage Notes (Signed)
Pt arrives with c/o having 2-3 episodes of rectal bleeding tonight. Pt does take eliquis and has a hx of GI bleed. Pt denies CP, SOB, or ABD pain.

## 2023-11-19 ENCOUNTER — Encounter (HOSPITAL_COMMUNITY): Payer: Self-pay | Admitting: Internal Medicine

## 2023-11-19 DIAGNOSIS — K922 Gastrointestinal hemorrhage, unspecified: Secondary | ICD-10-CM | POA: Diagnosis not present

## 2023-11-19 DIAGNOSIS — I480 Paroxysmal atrial fibrillation: Secondary | ICD-10-CM

## 2023-11-19 DIAGNOSIS — Z7901 Long term (current) use of anticoagulants: Secondary | ICD-10-CM

## 2023-11-19 DIAGNOSIS — Z9889 Other specified postprocedural states: Secondary | ICD-10-CM

## 2023-11-19 DIAGNOSIS — I10 Essential (primary) hypertension: Secondary | ICD-10-CM | POA: Diagnosis not present

## 2023-11-19 DIAGNOSIS — Z8679 Personal history of other diseases of the circulatory system: Secondary | ICD-10-CM

## 2023-11-19 DIAGNOSIS — G4733 Obstructive sleep apnea (adult) (pediatric): Secondary | ICD-10-CM

## 2023-11-19 DIAGNOSIS — K5790 Diverticulosis of intestine, part unspecified, without perforation or abscess without bleeding: Secondary | ICD-10-CM

## 2023-11-19 DIAGNOSIS — I5032 Chronic diastolic (congestive) heart failure: Secondary | ICD-10-CM

## 2023-11-19 DIAGNOSIS — K625 Hemorrhage of anus and rectum: Principal | ICD-10-CM

## 2023-11-19 DIAGNOSIS — R001 Bradycardia, unspecified: Secondary | ICD-10-CM

## 2023-11-19 LAB — TYPE AND SCREEN

## 2023-11-19 LAB — CBC
HCT: 39.4 % (ref 36.0–46.0)
HCT: 38.7 % (ref 36.0–46.0)
Hemoglobin: 13.4 g/dL (ref 12.0–15.0)
Hemoglobin: 13 g/dL (ref 12.0–15.0)
MCH: 31.6 pg (ref 26.0–34.0)
MCH: 31 pg (ref 26.0–34.0)
MCHC: 34 g/dL (ref 30.0–36.0)
MCHC: 33.6 g/dL (ref 30.0–36.0)
MCV: 92.9 fL (ref 80.0–100.0)
MCV: 92.1 fL (ref 80.0–100.0)
Platelets: 271 10*3/uL (ref 150–400)
Platelets: 278 10*3/uL (ref 150–400)
RBC: 4.24 MIL/uL (ref 3.87–5.11)
RBC: 4.2 MIL/uL (ref 3.87–5.11)
RDW: 12.8 % (ref 11.5–15.5)
RDW: 12.8 % (ref 11.5–15.5)
WBC: 5.9 10*3/uL (ref 4.0–10.5)
WBC: 5.7 10*3/uL (ref 4.0–10.5)
nRBC: 0 % (ref 0.0–0.2)
nRBC: 0 % (ref 0.0–0.2)

## 2023-11-19 LAB — ABO/RH: ABO/RH(D): O POS

## 2023-11-19 LAB — HIV ANTIBODY (ROUTINE TESTING W REFLEX): HIV Screen 4th Generation wRfx: NONREACTIVE

## 2023-11-19 MED ORDER — SODIUM CHLORIDE 0.9% FLUSH
3.0000 mL | Freq: Two times a day (BID) | INTRAVENOUS | Status: DC
  Administered 2023-11-19 – 2023-11-20 (×2): 3 mL via INTRAVENOUS

## 2023-11-19 MED ORDER — DRONEDARONE HCL 400 MG PO TABS
400.0000 mg | ORAL_TABLET | Freq: Two times a day (BID) | ORAL | Status: DC
  Administered 2023-11-19 – 2023-11-20 (×2): 400 mg via ORAL
  Filled 2023-11-19 (×4): qty 1

## 2023-11-19 MED ORDER — ACETAMINOPHEN 650 MG RE SUPP: 650.0000 mg | Freq: Four times a day (QID) | RECTAL | Status: DC | PRN

## 2023-11-19 MED ORDER — ACETAMINOPHEN 325 MG PO TABS
650.0000 mg | ORAL_TABLET | Freq: Four times a day (QID) | ORAL | Status: DC | PRN
  Administered 2023-11-19 – 2023-11-20 (×2): 650 mg via ORAL
  Filled 2023-11-19 (×2): qty 2

## 2023-11-19 MED ORDER — DILTIAZEM HCL ER COATED BEADS 120 MG PO CP24
120.0000 mg | ORAL_CAPSULE | Freq: Two times a day (BID) | ORAL | Status: DC
  Administered 2023-11-20: 120 mg via ORAL
  Filled 2023-11-19: qty 1

## 2023-11-19 MED ORDER — RANOLAZINE ER 500 MG PO TB12
500.0000 mg | ORAL_TABLET | Freq: Two times a day (BID) | ORAL | Status: DC
  Administered 2023-11-19 – 2023-11-20 (×2): 500 mg via ORAL
  Filled 2023-11-19 (×4): qty 1

## 2023-11-19 MED ORDER — HYDROCORTISONE (PERIANAL) 2.5 % EX CREA
TOPICAL_CREAM | Freq: Two times a day (BID) | CUTANEOUS | Status: DC
  Administered 2023-11-19: 1 via RECTAL
  Filled 2023-11-19: qty 28.35

## 2023-11-19 MED ORDER — ORAL CARE MOUTH RINSE: 15.0000 mL | OROMUCOSAL | Status: DC | PRN

## 2023-11-19 NOTE — ED Notes (Signed)
Pt states she took all of her morning meds at 1145 today, except hydrochlorothiazide and eliquis.

## 2023-11-19 NOTE — Plan of Care (Signed)
  Problem: Health Behavior/Discharge Planning: Goal: Ability to manage health-related needs will improve Outcome: Progressing   Problem: Clinical Measurements: Goal: Will remain free from infection Outcome: Progressing   Problem: Clinical Measurements: Goal: Complications related to the disease process, condition or treatment will be avoided or minimized Outcome: Progressing

## 2023-11-19 NOTE — H&P (Signed)
History and Physical   Amy Hanna ZOX:[id number] DOB: 1956-09-22 DOA: 11/18/2023  PCP: Gwenlyn Found, MD   Patient coming from: Home  Chief Complaint: Rectal bleeding   HPI: Amy Hanna is a 68 y.o. female with medical history significant of GI bleed, hypertension, hyperlipidemia, atrial fibrillation status post ablation, sinus bradycardia, diastolic CHF, obesity, OSA, DVT presenting with rectal bleeding.  Patient report onset of bright red blood with some dark mixed in yesterday evening.  No abdominal pain with no pain with defecation.  Reports multiple episodes of dark red blood since that time including blood on exam in the ED.  Patient is on Eliquis and her last dose was yesterday.  She does have a GI provider that she follows with after having a GI bleed in the past.  They are with [facility].  Denies fevers, chills, chest pain, short of breath, abdominal pain, constipation, diarrhea, nausea, vomiting.  ED Course: Vital signs in the ED notable for blood pressure in the 90s to 140 systolic, heart rate in the 40s to 60s.  Lab workup included CMP within normal limits.  CBC within normal limits.  Normal hemoglobin trend is 13.73 months ago, 14 on arrival, 13.4 on repeat.  Type and screen was performed.  FOBT positive.  Gross blood on exam.  EDP consulted GI who agreed to see patient.  Review of Systems: As per HPI otherwise all other systems reviewed and are negative.  Past Medical History:  Diagnosis Date   Acute deep vein thrombosis (DVT) of popliteal vein of left lower extremity (HCC) 02/23/2019   History of diastolic dysfunction    Hyperlipidemia    Hypertension    Obesity, morbid (HCC)    Obstructive sleep apnea    uses CPAP   Paroxysmal atrial fibrillation (HCC)    Sinus bradycardia    Symptomatic bradycardia 02/23/2019    Past Surgical History:  Procedure Laterality Date   ATRIAL FIBRILLATION ABLATION  12/2015   [facility]   CARDIAC  CATHETERIZATION     normal coronary arteries   CESAREAN SECTION     REPLACEMENT TOTAL KNEE BILATERAL      Social History  reports that she has quit smoking. She has never used smokeless tobacco. She reports that she does not drink alcohol and does not use drugs.  Allergies  Allergen Reactions   Atropine Swelling    Dry heaves   Enoxaparin Other (See Comments)    Hematoma -bleeding under muscles   Valsartan Other (See Comments)    dizziness   Ace Inhibitors Cough    Family History  Problem Relation Age of Onset   Cancer Mother    Heart disease Mother    Heart attack Father    Heart disease Father    Hypertension Father   Reviewed on admission  Prior to Admission medications   Medication Sig Start Date End Date Taking? Authorizing Provider  acetaminophen (TYLENOL 8 HOUR) 650 MG CR tablet Take 1,300 mg by mouth every 8 (eight) hours as needed for pain.   Yes [provider]  Apoaequorin (PREVAGEN PO) Take 1 tablet by mouth daily.   Yes [provider]  diltiazem (CARDIZEM CD) 120 MG 24 hr capsule Take 120 mg by mouth every 12 (twelve) hours.   Yes [provider]  diphenhydrAMINE (BENADRYL) 25 mg capsule Take 25 mg by mouth at bedtime as needed for sleep or allergies.   Yes [provider]  ELIQUIS 5 MG TABS tablet Take 5  mg by mouth every 12 (twelve) hours.   Yes [provider]  hydrochlorothiazide (HYDRODIURIL) 25 MG tablet Take 25 mg by mouth daily.   Yes [provider]  losartan (COZAAR) 25 MG tablet Take 25 mg by mouth daily. 06/09/18  Yes [provider]  Magnesium Oxide 250 MG TABS Take 500 mg by mouth at bedtime. 06/17/19  Yes [provider]  MULTAQ 400 MG tablet Take 400 mg by mouth every 12 (twelve) hours.   Yes [provider]  Potassium Chloride ER 20 MEQ TBCR Take 1 tablet by mouth daily. 11/08/22  Yes [provider]  ranolazine (RANEXA) 500 MG 12 hr tablet Take 1 tablet by  mouth every 12 (twelve) hours. 07/26/22  Yes [provider]  Vitamins-Lipotropics (LIPO FLAVONOID PLUS PO) Take 2 tablets by mouth every 12 (twelve) hours.   Yes [provider]  zolpidem (AMBIEN) 5 MG tablet Take 5 mg by mouth at bedtime as needed for sleep (Pt has been weening herself off of this medication.). 09/03/23  Yes [provider]    Physical Exam: Vitals:   11/19/23 1100 11/19/23 1130 11/19/23 1200 11/19/23 1234  BP: (!) 98/53 113/69 (!) 109/41   Pulse: (!) 52 (!) 56 (!) 52   Resp: 20 (!) 26 17   Temp:    98 F (36.7 C)  TempSrc:    Oral  SpO2: 99% 96% 94%   Weight:      Height:        Physical Exam Constitutional:      General: She is not in acute distress.    Appearance: Normal appearance. She is obese.  HENT:     Head: Normocephalic and atraumatic.     Mouth/Throat:     Mouth: Mucous membranes are moist.     Pharynx: Oropharynx is clear.  Eyes:     Extraocular Movements: Extraocular movements intact.     Pupils: Pupils are equal, round, and reactive to light.  Cardiovascular:     Rate and Rhythm: Regular rhythm. Bradycardia present.     Pulses: Normal pulses.     Heart sounds: Normal heart sounds.  Pulmonary:     Effort: Pulmonary effort is normal. No respiratory distress.     Breath sounds: Normal breath sounds.  Abdominal:     General: Bowel sounds are normal. There is no distension.     Palpations: Abdomen is soft.     Tenderness: There is no abdominal tenderness.  Musculoskeletal:        General: No swelling or deformity.  Skin:    General: Skin is warm and dry.  Neurological:     General: No focal deficit present.     Mental Status: Mental status is at baseline.    Labs on Admission: I have personally reviewed following labs and imaging studies  CBC: Recent Labs  Lab 11/18/23 2322 11/19/23 1007  WBC 5.4 5.9  HGB 14.0 13.4  HCT 41.5 39.4  MCV 92.8 92.9  PLT 298 271    Basic Metabolic Panel: Recent Labs   Lab 11/18/23 2322  NA 139  K 3.7  CL 105  CO2 24  GLUCOSE 97  BUN 17  CREATININE 0.60  CALCIUM 9.5    GFR: Estimated Creatinine Clearance: 97.3 mL/min (by C-G formula based on SCr of 0.6 mg/dL).  Liver Function Tests: Recent Labs  Lab 11/18/23 2322  AST 24  ALT 22  ALKPHOS 69  BILITOT 0.7  PROT 6.8  ALBUMIN 3.6  Urine analysis: No results found for: "COLORURINE", "APPEARANCEUR", "LABSPEC", "PHURINE", "GLUCOSEU", "HGBUR", "BILIRUBINUR", "KETONESUR", "PROTEINUR", "UROBILINOGEN", "NITRITE", "LEUKOCYTESUR"  Radiological Exams on Admission: No results found.  EKG: Not performed in the ED thus far.  Assessment/Plan Active Problems:   Essential hypertension   Paroxysmal atrial fibrillation (HCC)   Morbid obesity (HCC)   Obstructive sleep apnea   Chronic heart failure with preserved ejection fraction (HCC)   History of deep venous thrombosis (DVT) of distal vein of left lower extremity   S/P ablation of atrial fibrillation   Sinus bradycardia   GI bleed > History of prior GI bleed.  1 day of bright red blood per rectum with dark blood mixed in.  Hemoglobin remained stable.  Vital signs largely stable though has chronic bradycardia and has had some low normal blood pressures. > Gross blood on exam and FOBT positive.  No obviously bleeding hemorrhoids and not bright red on exam per EDP. > GI consulted in the ED. - Monitor on telemetry overnight - Appreciate GI recommendations and assistance - Trend CBC - Hold Eliquis for now - Full liquid diet - Supportive care  Hypertension - Holding hydrochlorothiazide, losartan given low normal blood pressure and possible ongoing bleed - Holding diltiazem this evening with plan to resume tomorrow if hemoglobin and blood pressure remained stable, she is bradycardic but is on this in case of breakthrough A-fib per cardiology note  Atrial fibrillation Bradycardia > Status post ablation x 2 for atrial fibrillation.  Has had  some breakthrough episodes in the past.  Is on diltiazem as above for breakthrough episodes. - Continue home Multaq, Ranexa - Restart diltiazem tomorrow - Holding Eliquis as above  OSA - Continue home CPAP  Obesity - Noted  DVT prophylaxis: SCDs Code Status:   Full Family Communication:  None on admission Disposition Plan:   Patient is from:  Home  Anticipated DC to:  Home  Anticipated DC date:  1 to 2 days  Anticipated DC barriers: None  Consults called:  GI Admission status:  Observation, telemetry  Severity of Illness: The appropriate patient status for this patient is OBSERVATION. Observation status is judged to be reasonable and necessary in order to provide the required intensity of service to ensure the patient's safety. The patient's presenting symptoms, physical exam findings, and initial radiographic and laboratory data in the context of their medical condition is felt to place them at decreased risk for further clinical deterioration. Furthermore, it is anticipated that the patient will be medically stable for discharge from the hospital within 2 midnights of admission.    Synetta Fail MD [facility]  How to contact the [facility] Attending or Consulting provider 7A - 7P or covering provider during after hours 7P -7A, for this patient?   Check the care team in [facility] and look for a) attending/consulting [facility] provider listed and b) the [facility] team listed Log into [email or web address] and use [facility] universal password to access. If you do not have the password, please contact the hospital operator. Locate the [facility] provider you are looking for under [facility] and page to a number that you can be directly reached. If you still have difficulty reaching the provider, please page the [facility] (Director on Call) for the Hospitalists listed on amion for assistance.  11/19/2023, 12:46 PM

## 2023-11-19 NOTE — Progress Notes (Signed)
   11/19/23 1939  BiPAP/CPAP/SIPAP  BiPAP/CPAP/SIPAP Pt Type Adult  BiPAP/CPAP/SIPAP Resmed  BiPAP/CPAP /SiPAP Vitals  Pulse Rate 60  SpO2 96 %     Pt stated her CPAP was being brought from home for her to wear tonight.  No assistance needed

## 2023-11-19 NOTE — Consult Note (Signed)
Consultation Note   Referring Provider:  [facility] PCP: Gwenlyn Found, MD Primary Gastroenterologist:      Reason for Consultation:  DOA: 11/18/2023         Hospital Day: 2   ASSESSMENT    Brief Narrative:  68 y.o. 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 Could be hemorrhoidal bleeding with protruding internal hemorrhoids on exam today. Diverticular hemorrhage is possible.   History of colon polyps. Up to date on surveillance exam Cecal sessile serrated polyp in 2018 which was apparently biopsied but not removed as she was admitted at the time for GI bleed. Unable to find the report but she does give a history of returning for colonoscopy with polypectomy.. She has since had surveillance colonoscopy in 2022 ( [facility]) at which time no recurrent polyps were founds.   Principal Problem:   GI bleed Active Problems:   Essential hypertension   Paroxysmal atrial fibrillation (HCC)   Morbid obesity (HCC)   Obstructive sleep apnea   Chronic heart failure with preserved ejection fraction (HCC)   History of deep venous thrombosis (DVT) of distal vein of left lower extremity   S/P ablation of atrial fibrillation   Sinus bradycardia      PLAN:   --If bleeding persists consider CT angio --Soft, elongated lesion left lateral wall of rectum, ? Hemorrhoid. --Treat empirically with anusol cream PR  --Monitor H/H --Eliquis is on hold --Full liquids okay for now.  HPI   Brief GI History:  Melonie a history of GI bleeding. She was admitted to [facility] in 2018 with GI bleed. Nuclear bleeding scan was negative.. EGD showed  duodenitis. A cecal polyp was removed.  Presumed to have diverticular hemorrhage on anticoagulation   Heleena admitted with rectal bleeding on Eliquis. Bleeding started last night after a formed, normal BM. She has had several episodes of painless bleeding and has even passed some clots. She doesn't have any rectal pain but describes some rectal fullness just prior to episodes of passing blood.  She doesn't typically take NSAIDs but did take ~ 3 Ibuprofen a few days ago for arthritic pain in shoulders.   In ED she was bradycardic, BP slightly low at times. Her hgb was 14.. BUN normal. FOBT +.   Updated history: Hgb has remained stable overnight at 13.4 this am.  She feels like volume of blood was less with the last episode of bleeding a short while ago.    Previous GI Evaluations  Surveillance colonoscopy July 2022 - [facility] Adequate bowel prep ( BBPS 6). Complete exam to cecum --Moderate pan-colonic diverticulosis was noted. Medium Internal hemorrhoids were seen on rectal retroflection    Colonoscopy in [place] in 09/02/2017 for GI bleed  -Extensive diverticular disease involving the sigmoid and descending colon with luminal distortion. Maroon blood was seen, but with vigorous washing I did not see bleeding or a clot coming from the diverticulum. The transverse colon was  notable for a little bit of liquid maroon blood with mild to moderate diverticular disease, but again with no active bleeding site identified. The right colon was also notable for mild diverticulosis. The cecum was reached and clearly identified by the IC valve and the appendiceal orifice was a hyperpigmented inflammatory-appearing polyp. Please see pictures. In light of the GI bleeding, a polypectomy was intentionally "now" performed and biopsies were obtained. Ileoscopy showed normal terminal ileum. Upon withdrawal, there was again extensive diverticular disease, left greater than right, with no diverticulum with active bleeding  identified.  IMPRESSION:  1. Cecal polyps, status post biopsy.  2. Extensive diverticular disease, left greater than right. 3. Probable diverticular bleed, but no active bleeding site identified and no localization of bleeding was able to be identified.  Path: Cecal polyp bx was a sessile serrated adenoma   EGD 09/02/2017 in [place] for GI bleed --Mild duodenitis, otherwise normal upper endoscopy.    Recent Labs    11/18/23 2322 11/19/23 1007  WBC 5.4 5.9  HGB 14.0 13.4  HCT 41.5 39.4  PLT 298 271   Recent Labs    11/18/23 2322  NA 139  K 3.7  CL 105  CO2 24  GLUCOSE 97  BUN 17  CREATININE 0.60  CALCIUM 9.5   Recent Labs    11/18/23 2322  PROT 6.8  ALBUMIN 3.6  AST 24  ALT 22  ALKPHOS 69  BILITOT 0.7   No results for input(s): "HEPBSAG", "HCVAB", "HEPAIGM", "HEPBIGM" in the last 72 hours. No results for input(s): "LABPROT", "INR" in the last 72 hours.    Past Medical History:  Diagnosis Date   Acute deep vein thrombosis (DVT) of popliteal vein of left lower extremity (HCC) 02/23/2019   History of diastolic dysfunction    Hyperlipidemia    Hypertension    Obesity, morbid (HCC)    Obstructive sleep apnea    uses CPAP   Paroxysmal atrial fibrillation (HCC)    Sinus bradycardia    Symptomatic bradycardia 02/23/2019    Past Surgical History:  Procedure Laterality Date   ATRIAL FIBRILLATION ABLATION  12/2015   [facility]   CARDIAC CATHETERIZATION     normal coronary arteries   CESAREAN SECTION     REPLACEMENT TOTAL KNEE BILATERAL      Family History  Problem Relation Age of Onset   Cancer Mother    Heart disease Mother    Heart attack Father    Heart disease Father    Hypertension Father     Prior to Admission medications   Medication Sig Start Date End Date Taking? Authorizing Provider  acetaminophen (TYLENOL 8 HOUR) 650 MG CR tablet Take 1,300 mg by mouth every 8 (eight) hours as needed for pain.   Yes [provider]  Apoaequorin (PREVAGEN PO) Take 1 tablet by mouth daily.   Yes [provider]  diltiazem (CARDIZEM CD) 120 MG 24 hr capsule Take 120 mg by mouth every 12 (twelve) hours.   Yes [provider]  diphenhydrAMINE (BENADRYL) 25 mg capsule Take 25 mg by mouth at bedtime as needed for sleep or allergies.   Yes [provider]  ELIQUIS 5 MG TABS tablet Take 5 mg by mouth every 12 (twelve) hours.   Yes [provider]  hydrochlorothiazide (HYDRODIURIL) 25 MG tablet Take 25 mg by mouth daily.   Yes [provider]  losartan (COZAAR) 25 MG tablet Take 25 mg by mouth daily. 06/09/18  Yes [provider]  Magnesium Oxide 250 MG TABS Take 500 mg by mouth at bedtime. 06/17/19  Yes [provider]  MULTAQ 400 MG tablet Take 400 mg by mouth every 12 (twelve) hours.   Yes [provider]  Potassium Chloride ER 20 MEQ TBCR Take 1 tablet by mouth daily. 11/08/22  Yes [provider]  ranolazine (RANEXA) 500 MG 12 hr tablet Take 1 tablet by mouth every 12 (twelve) hours. 07/26/22  Yes [provider]  Vitamins-Lipotropics (LIPO FLAVONOID PLUS PO) Take 2 tablets by mouth every 12 (twelve) hours.   Yes [provider]  zolpidem (AMBIEN) 5 MG tablet Take 5 mg by mouth at bedtime as needed for sleep (Pt has been weening herself off of this medication.). 09/03/23  Yes [provider]    Current Facility-Administered Medications  Medication Dose Route Frequency Provider Last Rate Last Admin   acetaminophen (TYLENOL) tablet 650 mg  650 mg Oral Q6H PRN Synetta Fail, MD       Or   acetaminophen (TYLENOL) suppository 650 mg  650 mg Rectal Q6H PRN Synetta Fail, MD       [START ON 11/20/2023] diltiazem (CARDIZEM CD) 24 hr capsule 120 mg  120 mg Oral Q12H Synetta Fail, MD       dronedarone (MULTAQ) tablet 400 mg  400 mg Oral Q12H Synetta Fail, MD       ranolazine (RANEXA) 12 hr tablet  500 mg  500 mg Oral Q12H Synetta Fail, MD       sodium chloride flush (NS) 0.9 % injection 3 mL  3 mL Intravenous Q12H Synetta Fail, MD       Current Outpatient Medications  Medication Sig Dispense Refill   acetaminophen (TYLENOL 8 HOUR) 650 MG CR tablet Take 1,300 mg by mouth every 8 (eight) hours as needed for pain.     Apoaequorin (PREVAGEN PO) Take 1 tablet by mouth daily.     diltiazem (CARDIZEM CD) 120 MG 24 hr capsule Take 120 mg by mouth every 12 (twelve) hours.     diphenhydrAMINE (BENADRYL) 25 mg capsule Take 25 mg by mouth at bedtime as needed for sleep or allergies.     ELIQUIS 5 MG TABS tablet Take 5 mg by mouth every 12 (twelve) hours.     hydrochlorothiazide (HYDRODIURIL) 25 MG tablet Take 25 mg by mouth daily.     losartan (COZAAR) 25 MG tablet Take 25 mg by mouth daily.  4   Magnesium Oxide 250 MG TABS Take 500 mg by mouth at bedtime.     MULTAQ 400 MG tablet Take 400 mg by mouth every 12 (twelve) hours.     Potassium Chloride ER 20 MEQ TBCR Take 1 tablet by mouth daily.     ranolazine (RANEXA) 500 MG 12 hr tablet Take 1 tablet by mouth every 12 (twelve) hours.     Vitamins-Lipotropics (LIPO FLAVONOID PLUS PO) Take 2 tablets by mouth every 12 (twelve) hours.     zolpidem (AMBIEN) 5 MG tablet Take 5 mg by mouth at bedtime as needed for sleep (Pt has been weening herself off of this medication.).      Allergies as of 11/18/2023 - Review Complete 11/18/2023  Allergen Reaction Noted   Atropine Swelling 07/22/2018   Valsartan Other (See Comments) 10/24/2018   Ace inhibitors Cough 07/22/2018    Social History   Socioeconomic History   Marital status: Married    Spouse name: Not on file  Number of children: Not on file   Years of education: Not on file   Highest education level: Not on file  Occupational History   Not on file  Tobacco Use   Smoking status: Former   Smokeless tobacco: Never  Vaping Use   Vaping status: Never Used  Substance and Sexual  Activity   Alcohol use: Never   Drug use: Never   Sexual activity: Not on file  Other Topics Concern   Not on file  Social History Narrative   Lives in [place], recently moved here from [place]   unemployed   RN   Social Drivers of Health   Financial Resource Strain: Not on file  Food Insecurity: Low Risk  (10/08/2023)   Received from [facility]   Hunger Vital Sign    Worried About Running Out of Food in the Last Year: Never true    Ran Out of Food in the Last Year: Never true  Transportation Needs: No Transportation Needs (10/08/2023)   Received from [facility]    In the past 12 months, has lack of reliable transportation kept you from medical appointments, meetings, work or from getting things needed for daily living? : No  Physical Activity: Not on file  Stress: Not on file  Social Connections: Not on file  Intimate Partner Violence: Not on file     Code Status   Code Status: Full Code  Review of Systems: All systems reviewed and negative except where noted in HPI.  Physical Exam: Vital signs in last 24 hours: Temp:  [97.5 F (36.4 C)-98.3 F (36.8 C)] 98 F (36.7 C) (02/18 1234) Pulse Rate:  [49-63] 51 (02/18 1330) Resp:  [16-27] 18 (02/18 1330) BP: (98-146)/(41-86) 110/74 (02/18 1330) SpO2:  [91 %-99 %] 97 % (02/18 1330) Weight:  [140.6 kg] 140.6 kg (02/18 0749)    General:  Pleasant female in NAD Psych:  Cooperative. Normal mood and affect Eyes: Pupils equal Ears:  Normal auditory acuity Nose: No deformity, discharge or lesions Neck:  Supple, no masses felt Lungs:  Clear to auscultation.  Heart:  Regular rate, regular rhythm.  Abdomen:  Soft, nondistended, nontender, active bowel sounds, no masses felt Rectal :  Red blood perianally. Protruding internal hemorrhoids. Elongated soft mobile lesion on left lateral wall ( possibly hemorrhoid) Msk: Symmetrical without gross deformities.  Neurologic:  Alert, oriented, grossly  normal neurologically Extremities : No edema Skin:  Intact without significant lesions.    Intake/Output from previous day: No intake/output data recorded. Intake/Output this shift:  No intake/output data recorded.   Willette Cluster, NP-C   11/19/2023, 1:54 PM

## 2023-11-19 NOTE — ED Provider Notes (Signed)
[facility] Provider Note  CSN: [id number] Arrival date & time: 11/18/23 2248  Chief Complaint(s) Rectal Bleeding  HPI Amy Hanna is a 68 y.o. female with past medical history as below, significant for A-fib on DOAC, OSA on CPAP, morbid obesity, HLD, hypertension, diastolic heart failure who presents to the ED with complaint of bleeding rectal.   Patient reports she noticed bright red blood with bowel movement yesterday around 9 PM.  Did have some dark blood mixed with the bright blood.  No pain with defecation.  No abdominal pain.  Has been having blood with each bowel movement since then.  No bleeding between bowel movements.  No blood in her urine.  No vomiting.  No hematemesis.  Last dose of Eliquis was yesterday morning. GI w/ [facility]  Past Medical History Past Medical History:  Diagnosis Date   Acute deep vein thrombosis (DVT) of popliteal vein of left lower extremity (HCC) 02/23/2019   History of diastolic dysfunction    Hyperlipidemia    Hypertension    Obesity, morbid (HCC)    Obstructive sleep apnea    uses CPAP   Paroxysmal atrial fibrillation (HCC)    Sinus bradycardia    Symptomatic bradycardia 02/23/2019   Patient Active Problem List   Diagnosis Date Noted   GI bleed 11/19/2023   Rectal bleeding 11/19/2023   Diverticulosis 11/19/2023   S/P ablation of atrial fibrillation 07/06/2022   Sinus bradycardia 02/01/2020   Chronic heart failure with preserved ejection fraction (HCC) 06/12/2019   Long term current use of anticoagulant 06/12/2019   History of deep venous thrombosis (DVT) of distal vein of left lower extremity 06/06/2019   Essential hypertension 07/22/2018   Paroxysmal atrial fibrillation (HCC) 07/22/2018   Morbid obesity (HCC) 07/22/2018   Obstructive sleep apnea 07/22/2018   Home Medication(s) Prior to Admission medications   Medication Sig Start Date End Date Taking? Authorizing Provider  acetaminophen (TYLENOL 8 HOUR) 650  MG CR tablet Take 1,300 mg by mouth every 8 (eight) hours as needed for pain.   Yes [provider]  Apoaequorin (PREVAGEN PO) Take 1 tablet by mouth daily.   Yes [provider]  diltiazem (CARDIZEM CD) 120 MG 24 hr capsule Take 120 mg by mouth every 12 (twelve) hours.   Yes [provider]  diphenhydrAMINE (BENADRYL) 25 mg capsule Take 25 mg by mouth at bedtime as needed for sleep or allergies.   Yes [provider]  ELIQUIS 5 MG TABS tablet Take 5 mg by mouth every 12 (twelve) hours.   Yes [provider]  Magnesium Oxide 250 MG TABS Take 500 mg by mouth at bedtime. 06/17/19  Yes [provider]  MULTAQ 400 MG tablet Take 400 mg by mouth every 12 (twelve) hours.   Yes [provider]  Potassium Chloride ER 20 MEQ TBCR Take 1 tablet by mouth daily. 11/08/22  Yes [provider]  ranolazine (RANEXA) 500 MG 12 hr tablet Take 1 tablet by mouth every 12 (twelve) hours. 07/26/22  Yes [provider]  Vitamins-Lipotropics (LIPO FLAVONOID PLUS PO) Take 2 tablets by mouth every 12 (twelve) hours.   Yes [provider]  zolpidem (AMBIEN) 5 MG tablet Take 5 mg by mouth at bedtime as needed for sleep (Pt has been weening herself off of this medication.). 09/03/23  Yes [provider]  hydrocortisone (ANUSOL-HC) 2.5 % rectal cream Place rectally 2 (two) times daily. 11/20/23   Glade Lloyd, MD  Past Surgical History Past Surgical History:  Procedure Laterality Date   ATRIAL FIBRILLATION ABLATION  12/2015   [facility]   CARDIAC CATHETERIZATION     normal coronary arteries   CESAREAN SECTION     REPLACEMENT TOTAL KNEE BILATERAL     Family History Family History  Problem Relation Age of Onset   Cancer Mother    Heart disease Mother    Heart attack Father     Heart disease Father    Hypertension Father     Social History Social History   Tobacco Use   Smoking status: Former   Smokeless tobacco: Never  Advertising account planner   Vaping status: Never Used  Substance Use Topics   Alcohol use: Never   Drug use: Never   Allergies Atropine, Enoxaparin, Valsartan, and Ace inhibitors  Review of Systems A thorough review of systems was obtained and all systems are negative except as noted in the HPI and PMH.   Physical Exam Vital Signs  I have reviewed the triage vital signs BP 111/69   Pulse (!) 55   Temp 98.6 F (37 C)   Resp 18   Ht 5\' 5"  (1.651 m)   Wt (!) 143.6 kg   SpO2 95%   BMI 52.67 kg/m  Physical Exam Vitals and nursing note reviewed. Exam conducted with a chaperone present.  Constitutional:      General: She is not in acute distress.    Appearance: Normal appearance. She is obese.  HENT:     Head: Normocephalic and atraumatic.     Right Ear: External ear normal.     Left Ear: External ear normal.     Nose: Nose normal.     Mouth/Throat:     Mouth: Mucous membranes are moist.  Eyes:     General: No scleral icterus.       Right eye: No discharge.        Left eye: No discharge.  Cardiovascular:     Rate and Rhythm: Normal rate and regular rhythm.     Pulses: Normal pulses.     Heart sounds: Normal heart sounds.  Pulmonary:     Effort: Pulmonary effort is normal. No respiratory distress.     Breath sounds: Normal breath sounds. No stridor.  Abdominal:     General: Abdomen is flat. There is no distension.     Palpations: Abdomen is soft.     Tenderness: There is no abdominal tenderness.  Genitourinary:    Comments: Small nonthrombosed external hemorrhoid, bright red blood noted externally  Musculoskeletal:     Cervical back: No rigidity.     Right lower leg: No edema.     Left lower leg: No edema.  Skin:    General: Skin is warm and dry.     Capillary Refill: Capillary refill takes less than 2 seconds.  Neurological:      Mental Status: She is alert and oriented to person, place, and time.     GCS: GCS eye subscore is 4. GCS verbal subscore is 5. GCS motor subscore is 6.  Psychiatric:        Mood and Affect: Mood normal.        Behavior: Behavior normal. Behavior is cooperative.     ED Results and Treatments Labs (all labs ordered are listed, but only abnormal results are displayed) Labs Reviewed  COMPREHENSIVE METABOLIC PANEL - Abnormal; Notable for the following components:      Result Value   Glucose,  Bld 115 (*)    Creatinine, Ser 1.10 (*)    Total Protein 5.8 (*)    Albumin 3.0 (*)    GFR, Estimated 55 (*)    All other components within normal limits  COMPREHENSIVE METABOLIC PANEL  CBC  CBC  HIV ANTIBODY (ROUTINE TESTING W REFLEX)  CBC  CBC  TYPE AND SCREEN  ABO/RH                                                                                                                          Radiology No results found.  Pertinent labs & imaging results that were available during my care of the patient were reviewed by me and considered in my medical decision making (see MDM for details).  Medications Ordered in ED Medications - No data to display                                                                                                                                   Procedures Procedures  (including critical care time)  Medical Decision Making / ED Course    Medical Decision Making:    Tosha Belgarde is a 68 y.o. female with past medical history as below, significant for A-fib on DOAC, OSA on CPAP, morbid obesity, HLD, hypertension, diastolic heart failure who presents to the ED with complaint of bleeding rectal. . The complaint involves an extensive differential diagnosis and also carries with it a high risk of complications and morbidity.  Serious etiology was considered. Ddx includes but is not limited to: AVM, diverticular bleed, hemorrhoid, fissure, perforation,  etc.  Complete initial physical exam performed, notably the patient was in no distress, abdomen is benign..    Reviewed and confirmed nursing documentation for past medical history, family history, social history.  Vital signs reviewed.    Clinical Course as of 11/20/23 2107  Tue Nov 19, 2023  1123 Rectal exam w/ gross blood, small non-thrombosed hemorrhoid noted. Chaperone Alana  [SG]    Clinical Course User Index [SG] Sloan Leiter, DO    Brief summary: 68 year old female history of paroxysmal A-fib on Eliquis here with rectal bleeding.  Primary blood.  Abdomen is benign.  HDS.  Hemoglobin stable.  Rectal exam with bright red blood.  No obvious melena.  No obvious source of bleeding externally.  Patient is anticoagulated on Eliquis, having rectal bleeding.  Recommend admission for serial H&H.  If bleeding continues, recommend engage GI  Given stability of HGB, only drop 0.6 g in 11 hours while in ED do not feel angio is needed at this time.   GI @ [facility] Dr Lucien [facility] here, will see in consult  She has a small hemorrhoid on exam that does not appear to be bleeding, diverticular source seems more likely given her hx  Admitted hospitalist              Additional history obtained: -Additional history obtained from na -External records from outside source obtained and reviewed including: Chart review including previous notes, labs, imaging, consultation notes including  Prior ed visits Home meds Prior hgb   Lab Tests: -I ordered, reviewed, and interpreted labs.   The pertinent results include:   Labs Reviewed  COMPREHENSIVE METABOLIC PANEL - Abnormal; Notable for the following components:      Result Value   Glucose, Bld 115 (*)    Creatinine, Ser 1.10 (*)    Total Protein 5.8 (*)    Albumin 3.0 (*)    GFR, Estimated 55 (*)    All other components within normal limits  COMPREHENSIVE METABOLIC PANEL  CBC  CBC  HIV ANTIBODY (ROUTINE TESTING W  REFLEX)  CBC  CBC  TYPE AND SCREEN  ABO/RH    Notable for hgb stable  EKG   EKG Interpretation Date/Time:    Ventricular Rate:    PR Interval:    QRS Duration:    QT Interval:    QTC Calculation:   R Axis:      Text Interpretation:           Imaging Studies ordered: na   Medicines ordered and prescription drug management: Meds ordered this encounter  Medications   DISCONTD: diltiazem (CARDIZEM CD) 24 hr capsule 120 mg   DISCONTD: dronedarone (MULTAQ) tablet 400 mg   DISCONTD: ranolazine (RANEXA) 12 hr tablet 500 mg   DISCONTD: sodium chloride flush (NS) 0.9 % injection 3 mL   DISCONTD: acetaminophen (TYLENOL) tablet 650 mg   DISCONTD: acetaminophen (TYLENOL) suppository 650 mg   DISCONTD: hydrocortisone (ANUSOL-HC) 2.5 % rectal cream   DISCONTD: Oral care mouth rinse   DISCONTD: apixaban (ELIQUIS) tablet 5 mg   hydrocortisone (ANUSOL-HC) 2.5 % rectal cream    Sig: Place rectally 2 (two) times daily.    Dispense:  30 g    Refill:  0    -I have reviewed the patients home medicines and have made adjustments as needed   Consultations Obtained: I requested consultation with the hospitalist   Cardiac Monitoring: The patient was maintained on a cardiac monitor.  I personally viewed and interpreted the cardiac monitored which showed an underlying rhythm of: nsr Continuous pulse oximetry interpreted by myself, 99% on RA.    Social Determinants of Health:  Diagnosis or treatment significantly limited by social determinants of health: obesity   Reevaluation: After the interventions noted above, I reevaluated the patient and found that they have stayed the same  Co morbidities that complicate the patient evaluation  Past Medical History:  Diagnosis Date   Acute deep vein thrombosis (DVT) of popliteal vein of left lower extremity (HCC) 02/23/2019   History of diastolic dysfunction    Hyperlipidemia    Hypertension    Obesity, morbid (HCC)    Obstructive  sleep apnea    uses CPAP   Paroxysmal atrial fibrillation (HCC)    Sinus bradycardia    Symptomatic bradycardia 02/23/2019  Dispostion: Disposition decision including need for hospitalization was considered, and patient admitted to the hospital.    Final Clinical Impression(s) / ED Diagnoses Final diagnoses:  Rectal bleeding  Anticoagulated        Sloan Leiter, DO 11/20/23 2107

## 2023-11-20 DIAGNOSIS — Z7901 Long term (current) use of anticoagulants: Secondary | ICD-10-CM | POA: Diagnosis not present

## 2023-11-20 DIAGNOSIS — K5790 Diverticulosis of intestine, part unspecified, without perforation or abscess without bleeding: Secondary | ICD-10-CM | POA: Diagnosis not present

## 2023-11-20 DIAGNOSIS — K922 Gastrointestinal hemorrhage, unspecified: Secondary | ICD-10-CM | POA: Diagnosis not present

## 2023-11-20 DIAGNOSIS — K625 Hemorrhage of anus and rectum: Secondary | ICD-10-CM | POA: Diagnosis not present

## 2023-11-20 LAB — COMPREHENSIVE METABOLIC PANEL
ALT: 17 U/L (ref 0–44)
AST: 18 U/L (ref 15–41)
Albumin: 3 g/dL — ABNORMAL LOW (ref 3.5–5.0)
Alkaline Phosphatase: 57 U/L (ref 38–126)
Anion gap: 9 (ref 5–15)
BUN: 12 mg/dL (ref 8–23)
CO2: 24 mmol/L (ref 22–32)
Calcium: 9.1 mg/dL (ref 8.9–10.3)
Chloride: 105 mmol/L (ref 98–111)
Creatinine, Ser: 1.1 mg/dL — ABNORMAL HIGH (ref 0.44–1.00)
GFR, Estimated: 55 mL/min — ABNORMAL LOW (ref >60)
Glucose, Bld: 115 mg/dL — ABNORMAL HIGH (ref 70–99)
Potassium: 3.7 mmol/L (ref 3.5–5.1)
Sodium: 138 mmol/L (ref 135–145)
Total Bilirubin: 0.7 mg/dL (ref 0.0–1.2)
Total Protein: 5.8 g/dL — ABNORMAL LOW (ref 6.5–8.1)

## 2023-11-20 LAB — CBC
HCT: 36.1 % (ref 36.0–46.0)
Hemoglobin: 12 g/dL (ref 12.0–15.0)
MCH: 30.8 pg (ref 26.0–34.0)
MCHC: 33.2 g/dL (ref 30.0–36.0)
MCV: 92.6 fL (ref 80.0–100.0)
Platelets: 256 10*3/uL (ref 150–400)
RBC: 3.9 MIL/uL (ref 3.87–5.11)
RDW: 12.8 % (ref 11.5–15.5)
WBC: 5.5 10*3/uL (ref 4.0–10.5)
nRBC: 0 % (ref 0.0–0.2)

## 2023-11-20 MED ORDER — APIXABAN 5 MG PO TABS
5.0000 mg | ORAL_TABLET | Freq: Two times a day (BID) | ORAL | Status: DC
  Administered 2023-11-20: 5 mg via ORAL
  Filled 2023-11-20: qty 1

## 2023-11-20 MED ORDER — HYDROCORTISONE (PERIANAL) 2.5 % EX CREA: TOPICAL_CREAM | Freq: Two times a day (BID) | CUTANEOUS | 0 refills | Status: AC

## 2023-11-20 NOTE — Progress Notes (Signed)
    Gastroenterology Inpatient Follow Up    Subjective: Patient overall is feeling well.   She only saw small amount of blood when she wiped but none in her stools. Denies rectal pain  Objective: Vital signs in last 24 hours: Temp:  [97.6 F (36.4 C)-98.6 F (37 C)] 98.6 F (37 C) (02/19 0828) Pulse Rate:  [55-60] 55 (02/19 0828) Resp:  [18] 18 (02/19 0828) BP: (111-113)/(65-69) 111/69 (02/19 0828) SpO2:  [95 %-96 %] 95 % (02/19 0828) Last BM Date : 11/19/23  Intake/Output from previous day: 02/18 0701 - 02/19 0700 In: 1160 [P.O.:1160] Out: -  Intake/Output this shift: No intake/output data recorded.  General appearance: alert and cooperative Resp: no increased WOB Cardio: regular rate GI: non-tender, non-distended  Lab Results: Recent Labs    11/19/23 1007 11/19/23 1720 11/20/23 0407  WBC 5.9 5.7 5.5  HGB 13.4 13.0 12.0  HCT 39.4 38.7 36.1  PLT 271 278 256   BMET Recent Labs    11/18/23 2322 11/20/23 0407  NA 139 138  K 3.7 3.7  CL 105 105  CO2 24 24  GLUCOSE 97 115*  BUN 17 12  CREATININE 0.60 1.10*  CALCIUM 9.5 9.1   LFT Recent Labs    11/20/23 0407  PROT 5.8*  ALBUMIN 3.0*  AST 18  ALT 17  ALKPHOS 57  BILITOT 0.7   PT/INR No results for input(s): "LABPROT", "INR" in the last 72 hours. Hepatitis Panel No results for input(s): "HEPBSAG", "HCVAB", "HEPAIGM", "HEPBIGM" in the last 72 hours. C-Diff No results for input(s): "CDIFFTOX" in the last 72 hours.  Studies/Results: No results found.  Medications: I have reviewed the patient's current medications. Scheduled:  apixaban  5 mg Oral Q12H   diltiazem  120 mg Oral Q12H   dronedarone  400 mg Oral Q12H   hydrocortisone   Rectal BID   ranolazine  500 mg Oral Q12H   sodium chloride flush  3 mL Intravenous Q12H   Continuous: WGN:[id number] **OR** acetaminophen, mouth rinse  Assessment/Plan: 68 year old female with history of A-fib, DVT/PE on Eliquis, OSA on CPAP, HFpEF, obesity,  diverticulosis presented with hematochezia. Patient had a diverticular bleed 7 years ago.  patient's hemoglobin has remained normal.Her last colonoscopy in 2022 showed moderate pandiverticulosis and internal hemorrhoids.   Would favor that the patient had a diverticular bleed and/for hemorrhoidal bleeding that has improved.  Patient's bleeding is overall improving as well.  - Okay to restart Eliquis - We will arrange for outpatient GI follow-up per patient request   LOS: 0 days   Imogene Burn 11/20/2023, 4:11 PM

## 2023-11-20 NOTE — Plan of Care (Signed)
  Problem: Clinical Measurements: Goal: Ability to maintain clinical measurements within normal limits will improve Outcome: Progressing   Problem: Health Behavior/Discharge Planning: Goal: Ability to manage health-related needs will improve Outcome: Progressing   

## 2023-11-20 NOTE — TOC Transition Note (Signed)
Transition of Care [facility]) - Discharge Note   Patient Details  Name: Amy Hanna MRN: [id number] Date of Birth: 01/22/1956  Transition of Care [facility]) CM/SW Contact:  Tom-Johnson, Hershal Coria, RN Phone Number: 11/20/2023, 1:05 PM   Clinical Narrative:     Patient is scheduled for discharge today.  Outpatient referral, hospital f/u and discharge instructions on AVS. No TOC needs or recommendations noted. Daughter, Raynelle Fanning to transport at discharge.  No further TOC needs noted.           Final next level of care: Home/Self Care Barriers to Discharge: Barriers Resolved   Patient Goals and CMS Choice Patient states their goals for this hospitalization and ongoing recovery are:: To return home CMS Medicare.gov Compare Post Acute Care list provided to:: Patient Choice offered to / list presented to : NA      Discharge Placement                Patient to be transferred to facility by: Daughter Name of family member notified: Raynelle Fanning    Discharge Plan and Services Additional resources added to the After Visit Summary for                  DME Arranged: N/A DME Agency: NA       HH Arranged: NA HH Agency: NA        Social Drivers of Health (SDOH) Interventions SDOH Screenings   Food Insecurity: No Food Insecurity (11/20/2023)  Housing: Low Risk  (10/08/2023)   Received from [facility]  Transportation Needs: No Transportation Needs (10/08/2023)   Received from [facility]  Utilities: Low Risk  (10/08/2023)   Received from [facility]  Tobacco Use: Medium Risk (11/18/2023)     Readmission Risk Interventions     No data to display

## 2023-11-20 NOTE — Care Management Obs Status (Signed)
MEDICARE OBSERVATION STATUS NOTIFICATION   Patient Details  Name: Amy Hanna MRN: [id number] Date of Birth: 1956-05-24   Medicare Observation Status Notification Given:  Yes    Tom-Johnson, Hershal Coria, RN 11/20/2023, 12:59 PM

## 2023-11-20 NOTE — Discharge Summary (Signed)
Physician Discharge Summary  Amy Hanna ZOX:[id number] DOB: May 19, 1956 DOA: 11/18/2023  PCP: Gwenlyn Found, MD  Admit date: 11/18/2023 Discharge date: 11/20/2023  Admitted From: Home Disposition: Home  Recommendations for Outpatient Follow-up:  Follow up with PCP in 1 week with repeat CBC/BMP Outpatient follow-up with GI Follow up in ED if symptoms worsen or new appear   Home Health: No Equipment/Devices: None  Discharge Condition: Stable CODE STATUS: Full Diet recommendation: Heart healthy  Brief/Interim Summary: 68 y.o. female with medical history significant of GI bleed, hypertension, hyperlipidemia, atrial fibrillation status post ablation, sinus bradycardia, diastolic CHF, obesity, OSA, DVT presented with rectal bleeding.  Hemoglobin was 14 and repeat was 13.4.  GI was consulted.  Patient was managed conservatively.  Eliquis was held.  Subsequently, she has not had any more rectal bleeding.  Hemoglobin has remained stable.  Eliquis has been resumed after GI clearance.  GI has cleared the patient for discharge.  Discharge patient home today.  Outpatient follow-up with PCP and GI.  Discharge Diagnoses:   Probable lower GI bleeding -Presented with rectal bleeding.  Probably hemorrhoidal bleeding versus diverticular bleeding.  Patient has been started on Anusol which will be continued. -Hemoglobin was 14 and repeat was 13.4.  GI was consulted.  Patient was managed conservatively.  Eliquis was held.  Subsequently, she has not had any more rectal bleeding.  Hemoglobin has remained stable.  Eliquis has been resumed after GI clearance.  GI has cleared the patient for discharge.  Discharge patient home today.  Outpatient follow-up with PCP and GI.  Hypertension -Blood pressure mostly on the lower side.  Home regimen on hold except diltiazem till reevaluation by PCP.  Paroxysmal A-fib -With history of ablation x 2 -Continue diltiazem.  Resume Eliquis as discussed above.  Continue  Multaq.  Outpatient follow-up with cardiology  OSA -Continue CPAP at night  Morbid obesity/obesity class III -Outpatient follow-up   Discharge Instructions  Discharge Instructions     Ambulatory referral to Gastroenterology   Complete by: As directed    Hospital follow up   What is the reason for referral?: Other   Diet - low sodium heart healthy   Complete by: As directed    Increase activity slowly   Complete by: As directed       Allergies as of 11/20/2023       Reactions   Atropine Swelling   Dry heaves   Enoxaparin Other (See Comments)   Hematoma -bleeding under muscles   Valsartan Other (See Comments)   dizziness   Ace Inhibitors Cough        Medication List     STOP taking these medications    hydrochlorothiazide 25 MG tablet Commonly known as: HYDRODIURIL   losartan 25 MG tablet Commonly known as: COZAAR       TAKE these medications    diltiazem 120 MG 24 hr capsule Commonly known as: CARDIZEM CD Take 120 mg by mouth every 12 (twelve) hours.   diphenhydrAMINE 25 mg capsule Commonly known as: BENADRYL Take 25 mg by mouth at bedtime as needed for sleep or allergies.   Eliquis 5 MG Tabs tablet Generic drug: apixaban Take 5 mg by mouth every 12 (twelve) hours.   hydrocortisone 2.5 % rectal cream Commonly known as: ANUSOL-HC Place rectally 2 (two) times daily.   LIPO FLAVONOID PLUS PO Take 2 tablets by mouth every 12 (twelve) hours.   Magnesium Oxide 250 MG Tabs Take 500 mg by mouth at bedtime.   Multaq  400 MG tablet Generic drug: dronedarone Take 400 mg by mouth every 12 (twelve) hours.   Potassium Chloride ER 20 MEQ Tbcr Take 1 tablet by mouth daily.   PREVAGEN PO Take 1 tablet by mouth daily.   ranolazine 500 MG 12 hr tablet Commonly known as: RANEXA Take 1 tablet by mouth every 12 (twelve) hours.   Tylenol 8 Hour 650 MG CR tablet Generic drug: acetaminophen Take 1,300 mg by mouth every 8 (eight) hours as needed for  pain.   zolpidem 5 MG tablet Commonly known as: AMBIEN Take 5 mg by mouth at bedtime as needed for sleep (Pt has been weening herself off of this medication.).        Follow-up Information     Gwenlyn Found, MD. Schedule an appointment as soon as possible for a visit in 1 week(s).   Specialty: Family Medicine               Allergies  Allergen Reactions   Atropine Swelling    Dry heaves   Enoxaparin Other (See Comments)    Hematoma -bleeding under muscles   Valsartan Other (See Comments)    dizziness   Ace Inhibitors Cough    Consultations: Cardiology   Procedures/Studies: No results found.    Subjective: Patient seen and examined at bedside.  Has not had any more rectal bleeding.  Feels okay to go home today.  No fever, abdominal pain reported  Discharge Exam: Vitals:   11/19/23 2030 11/20/23 0828  BP: 113/65 111/69  Pulse: (!) 55 (!) 55  Resp: 18 18  Temp: 97.6 F (36.4 C) 98.6 F (37 C)  SpO2: 96% 95%    General: Pt is alert, awake, not in acute distress Cardiovascular: Mild intermittent bradycardia present, S1/S2 + Respiratory: bilateral decreased breath sounds at bases Abdominal: Soft, morbidly obese, NT, ND, bowel sounds + Extremities: no edema, no cyanosis    The results of significant diagnostics from this hospitalization (including imaging, microbiology, ancillary and laboratory) are listed below for reference.     Microbiology: No results found for this or any previous visit (from the past 240 hours).   Labs: BNP (last 3 results) No results for input(s): "BNP" in the last 8760 hours. Basic Metabolic Panel: Recent Labs  Lab 11/18/23 2322 11/20/23 0407  NA 139 138  K 3.7 3.7  CL 105 105  CO2 24 24  GLUCOSE 97 115*  BUN 17 12  CREATININE 0.60 1.10*  CALCIUM 9.5 9.1   Liver Function Tests: Recent Labs  Lab 11/18/23 2322 11/20/23 0407  AST 24 18  ALT 22 17  ALKPHOS 69 57  BILITOT 0.7 0.7  PROT 6.8 5.8*  ALBUMIN  3.6 3.0*   No results for input(s): "LIPASE", "AMYLASE" in the last 168 hours. No results for input(s): "AMMONIA" in the last 168 hours. CBC: Recent Labs  Lab 11/18/23 2322 11/19/23 1007 11/19/23 1720 11/20/23 0407  WBC 5.4 5.9 5.7 5.5  HGB 14.0 13.4 13.0 12.0  HCT 41.5 39.4 38.7 36.1  MCV 92.8 92.9 92.1 92.6  PLT 298 271 278 256   Cardiac Enzymes: No results for input(s): "CKTOTAL", "CKMB", "CKMBINDEX", "TROPONINI" in the last 168 hours. BNP: Invalid input(s): "POCBNP" CBG: No results for input(s): "GLUCAP" in the last 168 hours. D-Dimer No results for input(s): "DDIMER" in the last 72 hours. Hgb A1c No results for input(s): "HGBA1C" in the last 72 hours. Lipid Profile No results for input(s): "CHOL", "HDL", "LDLCALC", "TRIG", "CHOLHDL", "LDLDIRECT" in the  last 72 hours. Thyroid function studies No results for input(s): "TSH", "T4TOTAL", "T3FREE", "THYROIDAB" in the last 72 hours.  Invalid input(s): "FREET3" Anemia work up No results for input(s): "VITAMINB12", "FOLATE", "FERRITIN", "TIBC", "IRON", "RETICCTPCT" in the last 72 hours. Urinalysis No results found for: "COLORURINE", "APPEARANCEUR", "LABSPEC", "PHURINE", "GLUCOSEU", "HGBUR", "BILIRUBINUR", "KETONESUR", "PROTEINUR", "UROBILINOGEN", "NITRITE", "LEUKOCYTESUR" Sepsis Labs Recent Labs  Lab 11/18/23 2322 11/19/23 1007 11/19/23 1720 11/20/23 0407  WBC 5.4 5.9 5.7 5.5   Microbiology No results found for this or any previous visit (from the past 240 hours).   Time coordinating discharge: 35 minutes  SIGNED:   Glade Lloyd, MD  [facility] 11/20/2023, 12:44 PM

## 2023-11-21 ENCOUNTER — Telehealth: Payer: Self-pay

## 2023-11-21 NOTE — Telephone Encounter (Signed)
Called patient per provider request to schedule office visit no answer left message on voice mail to call office back.

## 2023-11-25 ENCOUNTER — Encounter: Payer: Self-pay | Admitting: Internal Medicine

## 2024-02-17 ENCOUNTER — Encounter: Payer: Self-pay | Admitting: Internal Medicine

## 2024-02-17 ENCOUNTER — Ambulatory Visit: Payer: PRIVATE HEALTH INSURANCE | Admitting: Internal Medicine

## 2024-02-17 VITALS — BP 102/74 | HR 53 | Ht 63.25 in | Wt 319.4 lb

## 2024-02-17 DIAGNOSIS — K5730 Diverticulosis of large intestine without perforation or abscess without bleeding: Secondary | ICD-10-CM | POA: Diagnosis not present

## 2024-02-17 DIAGNOSIS — R066 Hiccough: Secondary | ICD-10-CM | POA: Diagnosis not present

## 2024-02-17 DIAGNOSIS — K625 Hemorrhage of anus and rectum: Secondary | ICD-10-CM

## 2024-02-17 DIAGNOSIS — K5790 Diverticulosis of intestine, part unspecified, without perforation or abscess without bleeding: Secondary | ICD-10-CM

## 2024-02-17 DIAGNOSIS — K648 Other hemorrhoids: Secondary | ICD-10-CM

## 2024-02-17 DIAGNOSIS — K649 Unspecified hemorrhoids: Secondary | ICD-10-CM

## 2024-02-17 NOTE — Progress Notes (Signed)
 Chief Complaint: Rectal bleeding  HPI : 68 year old female with history of obesity, DVT on Eliquis , OSA on CPAP, A-fib presents for follow-up of rectal bleeding  Her last colonoscopy in 2022 showed moderate pandiverticulosis and internal hemorrhoids. She did have a tubular adenoma removed during another colonoscopy 7-8 years ago. Father had diverticulitis. Denies family history of colon cancer.  Patient was hospitalized 11/18/2023 to 11/20/2023 for rectal bleeding that was likely due to either hemorrhoids or diverticular bleed.  She was started on Anusol  HC cream.  Her hemoglobin remained relatively stable.  Her Eliquis  was temporarily held but then restarted prior to discharge.  Her rectal bleeding resolved almost as quickly as it came on.  Denies any further episodes of rectal bleeding after resolution of her recent bleeding.  Denies rectal pain. She used Anusol  HC cream to help with itching. Denies weight loss. Denies ab pain. Denies constipation or diarrhea. Denies N&V. She does have some hiccups, which flare up on occasion. Sometimes she will not eat anything and she will still start hiccupping.  She is a former [employer].   Past Medical History:  Diagnosis Date   Acute deep vein thrombosis (DVT) of popliteal vein of left lower extremity (HCC) 02/23/2019   Arthritis    Diverticulitis    History of diastolic dysfunction    History of GI diverticular bleed    Hyperlipidemia    Hypertension    Obesity, morbid (HCC)    Obstructive sleep apnea    uses CPAP   Paroxysmal atrial fibrillation (HCC)    Sinus bradycardia    Symptomatic bradycardia 02/23/2019     Past Surgical History:  Procedure Laterality Date   ATRIAL FIBRILLATION ABLATION  12/2015   [facility]   CARDIAC CATHETERIZATION     normal coronary arteries   CESAREAN SECTION     x 2   REPLACEMENT TOTAL KNEE BILATERAL Bilateral    1 on right, left x 2   Family History  Problem Relation Age of Onset    Lymphoma Mother        malignant plueral effusion   Heart disease Mother    Heart attack Father 40   Heart disease Father    Hypertension Father    Dementia Sister    Autoimmune disease Sister    Schizophrenia Brother    Cancer Brother        nasal cavity   Social History   Tobacco Use   Smoking status: Former    Current packs/day: 0.00    Types: Cigarettes    Quit date: 1986    Years since quitting: 39.4   Smokeless tobacco: Never  Vaping Use   Vaping status: Never Used  Substance Use Topics   Alcohol use: Yes    Comment: occasional   Drug use: Never   Current Outpatient Medications  Medication Sig Dispense Refill   acetaminophen  (TYLENOL  8 HOUR) 650 MG CR tablet Take 1,300 mg by mouth every 8 (eight) hours as needed for pain.     Apoaequorin (PREVAGEN PO) Take 1 tablet by mouth daily.     diltiazem  (CARDIZEM  CD) 120 MG 24 hr capsule Take 120 mg by mouth every 12 (twelve) hours.     diphenhydrAMINE (BENADRYL) 25 mg capsule Take 25 mg by mouth at bedtime as needed for sleep or allergies.     ELIQUIS  5 MG TABS tablet Take 5 mg by mouth every 12 (twelve) hours.     hydrochlorothiazide (HYDRODIURIL) 25 MG tablet  Take 25 mg by mouth daily.     hydrocortisone  (ANUSOL -HC) 2.5 % rectal cream Place rectally 2 (two) times daily. 30 g 0   Lemon Bioflavonoid (LEMON BIOFLAVANOID) POWD Take by mouth. Taking 1 tablets by mouth in the mornings and 2 in the evenings     loratadine (CLARITIN) 10 MG tablet Take 10 mg by mouth as needed.     losartan (COZAAR) 25 MG tablet Take 1 tablet by mouth daily.     Magnesium Oxide 250 MG TABS Take 500 mg by mouth at bedtime.     MULTAQ  400 MG tablet Take 400 mg by mouth every 12 (twelve) hours.     Potassium Chloride ER 20 MEQ TBCR Take 1 tablet by mouth daily.     ranolazine  (RANEXA ) 500 MG 12 hr tablet Take 1 tablet by mouth every 12 (twelve) hours.     Vitamins-Lipotropics (LIPO FLAVONOID PLUS PO) Take 2 tablets by mouth every 12 (twelve) hours.      zolpidem (AMBIEN) 5 MG tablet Take 5 mg by mouth at bedtime as needed for sleep (Pt has been weening herself off of this medication.).     No current facility-administered medications for this visit.   Allergies  Allergen Reactions   Atropine Swelling    Dry heaves   Enoxaparin Other (See Comments)    Hematoma -bleeding under muscles   Valsartan Other (See Comments)    dizziness   Ace Inhibitors Cough    Review of Systems: All systems reviewed and negative except where noted in HPI.   Physical Exam: BP 102/74 (BP Location: Left Arm, Patient Position: Sitting, Cuff Size: Large)   Pulse (!) 53   Ht 5' 3.25" (1.607 m)   Wt (!) 319 lb 6 oz (144.9 kg)   BMI 56.13 kg/m  Constitutional: Pleasant,well-developed, female in no acute distress. HEENT: Normocephalic and atraumatic. Conjunctivae are normal. No scleral icterus. Cardiovascular: Normal rate, regular rhythm.  Pulmonary/chest: Effort normal and breath sounds normal. No wheezing, rales or rhonchi. Abdominal: Soft, nondistended, nontender. Bowel sounds active throughout. There are no masses palpable. No hepatomegaly. Extremities: No edema Neurological: Alert and oriented to person place and time. Skin: Skin is warm and dry. No rashes noted. Psychiatric: Normal mood and affect. Behavior is normal.  Labs 12/2023: CBC with nml 13.8. CMP nml.   Colonoscopy 04/25/21: Findings Adequate prep Moderate pan-colonic diverticulosis was noted.  Medium Internal hemorrhoids were seen on rectal retroflection.  Repeat colonoscopy recommended in 5 years.  ASSESSMENT AND PLAN: Rectal bleeding Diverticulosis Hemorrhoids Hiccups Patient had a recent episode of rectal bleeding in 11/2023 that self resolved.  Her hemoglobin remained normal during this episode, suggesting against substantial blood loss.  She does have a history of diverticular bleeding and her last colonoscopy was in 2022 that showed pandiverticulosis and internal hemorrhoids.   At this time we will monitor for any recurrence in her rectal bleeding.  I recommended that she follow a high-fiber diet, which would help with both diverticular bleeding and hemorrhoidal bleeding.  Patient does describe some issues with hiccups but only has rare acid reflux depending on what she eats.  I did offer her an acid reflux medication to see if this helps with her hiccups, but she declined since her hiccups are not severe enough at this time. - High fiber diet - Patient let me know if she has a recurrence in rectal bleeding - Declined acid reflux medication for hiccups - Next colonoscopy due in 03/2026 for colon cancer screening -  RTC PRN  Regino Caprio, MD  I spent 36 minutes of time, including in depth chart review, independent review of results as outlined above, communicating results with the patient directly, face-to-face time with the patient, coordinating care, and ordering studies and medications as appropriate, and documentation.

## 2024-02-17 NOTE — Patient Instructions (Addendum)
 Follow up as needed  If your blood pressure at your visit was 140/90 or greater, please contact your primary care physician to follow up on this.  If you are age 67 or older, your body mass index should be between 23-30. Your Body mass index is 56.13 kg/m. If this is out of the aforementioned range listed, please consider follow up with your Primary Care Provider.  If you are age 24 or younger, your body mass index should be between 19-25. Your Body mass index is 56.13 kg/m. If this is out of the aformentioned range listed, please consider follow up with your Primary Care Provider.    The [facility] providers would like to encourage you to use MYCHART to communicate with providers for non-urgent requests or questions.  Due to long hold times on the telephone, sending your provider a message by [facility] may be a faster and more efficient way to get a response.  Please allow 48 business hours for a response.  Please remember that this is for non-urgent requests.  _______________________________________________________  Thank you for entrusting me with your care and for choosing [facility],  Dr. Regino Caprio
